# Patient Record
Sex: Female | Born: 1975 | Race: Black or African American | Hispanic: No | Marital: Single | Smoking: Never smoker
Health system: Southern US, Community
[De-identification: ages and names within clinical notes are randomized; demographics above are authoritative.]

## PROBLEM LIST (undated history)

## (undated) DIAGNOSIS — M329 Systemic lupus erythematosus, unspecified: Secondary | ICD-10-CM

## (undated) DIAGNOSIS — F5081 Binge eating disorder: Secondary | ICD-10-CM

## (undated) DIAGNOSIS — I10 Essential (primary) hypertension: Secondary | ICD-10-CM

## (undated) DIAGNOSIS — E119 Type 2 diabetes mellitus without complications: Secondary | ICD-10-CM

## (undated) DIAGNOSIS — IMO0002 Reserved for concepts with insufficient information to code with codable children: Secondary | ICD-10-CM

## (undated) DIAGNOSIS — F50819 Binge eating disorder, unspecified: Secondary | ICD-10-CM

## (undated) DIAGNOSIS — M1990 Unspecified osteoarthritis, unspecified site: Secondary | ICD-10-CM

## (undated) DIAGNOSIS — M797 Fibromyalgia: Secondary | ICD-10-CM

## (undated) HISTORY — DX: Unspecified osteoarthritis, unspecified site: M19.90

## (undated) HISTORY — PX: CHOLECYSTECTOMY: SHX55

## (undated) HISTORY — DX: Binge eating disorder, unspecified: F50.819

## (undated) HISTORY — DX: Fibromyalgia: M79.7

## (undated) HISTORY — PX: VAGINAL HYSTERECTOMY: SUR661

## (undated) HISTORY — DX: Binge eating disorder: F50.81

## (undated) HISTORY — DX: Type 2 diabetes mellitus without complications: E11.9

---

## 2016-10-23 DIAGNOSIS — M329 Systemic lupus erythematosus, unspecified: Secondary | ICD-10-CM | POA: Diagnosis not present

## 2016-10-23 DIAGNOSIS — I10 Essential (primary) hypertension: Secondary | ICD-10-CM | POA: Diagnosis not present

## 2016-10-23 DIAGNOSIS — M25561 Pain in right knee: Secondary | ICD-10-CM | POA: Diagnosis not present

## 2016-10-23 DIAGNOSIS — M25471 Effusion, right ankle: Secondary | ICD-10-CM | POA: Diagnosis not present

## 2016-10-25 DIAGNOSIS — E119 Type 2 diabetes mellitus without complications: Secondary | ICD-10-CM | POA: Diagnosis not present

## 2016-10-31 DIAGNOSIS — R05 Cough: Secondary | ICD-10-CM | POA: Diagnosis not present

## 2016-10-31 DIAGNOSIS — B9789 Other viral agents as the cause of diseases classified elsewhere: Secondary | ICD-10-CM | POA: Diagnosis not present

## 2016-10-31 DIAGNOSIS — J069 Acute upper respiratory infection, unspecified: Secondary | ICD-10-CM | POA: Diagnosis not present

## 2016-11-08 DIAGNOSIS — R05 Cough: Secondary | ICD-10-CM | POA: Diagnosis not present

## 2016-11-08 DIAGNOSIS — I10 Essential (primary) hypertension: Secondary | ICD-10-CM | POA: Diagnosis not present

## 2016-11-20 DIAGNOSIS — Z1231 Encounter for screening mammogram for malignant neoplasm of breast: Secondary | ICD-10-CM | POA: Diagnosis not present

## 2016-11-25 DIAGNOSIS — J302 Other seasonal allergic rhinitis: Secondary | ICD-10-CM | POA: Diagnosis not present

## 2016-11-25 DIAGNOSIS — M7989 Other specified soft tissue disorders: Secondary | ICD-10-CM | POA: Diagnosis not present

## 2016-11-25 DIAGNOSIS — R05 Cough: Secondary | ICD-10-CM | POA: Diagnosis not present

## 2016-11-25 DIAGNOSIS — R0602 Shortness of breath: Secondary | ICD-10-CM | POA: Diagnosis not present

## 2016-11-29 DIAGNOSIS — F411 Generalized anxiety disorder: Secondary | ICD-10-CM | POA: Diagnosis not present

## 2016-12-14 DIAGNOSIS — R0602 Shortness of breath: Secondary | ICD-10-CM | POA: Diagnosis not present

## 2016-12-14 DIAGNOSIS — M329 Systemic lupus erythematosus, unspecified: Secondary | ICD-10-CM | POA: Diagnosis not present

## 2016-12-14 DIAGNOSIS — M2550 Pain in unspecified joint: Secondary | ICD-10-CM | POA: Diagnosis not present

## 2016-12-14 DIAGNOSIS — R05 Cough: Secondary | ICD-10-CM | POA: Diagnosis not present

## 2016-12-14 DIAGNOSIS — R609 Edema, unspecified: Secondary | ICD-10-CM | POA: Diagnosis not present

## 2016-12-14 DIAGNOSIS — Z79899 Other long term (current) drug therapy: Secondary | ICD-10-CM | POA: Diagnosis not present

## 2016-12-17 DIAGNOSIS — K219 Gastro-esophageal reflux disease without esophagitis: Secondary | ICD-10-CM | POA: Diagnosis not present

## 2016-12-17 DIAGNOSIS — K900 Celiac disease: Secondary | ICD-10-CM | POA: Diagnosis not present

## 2016-12-17 DIAGNOSIS — D136 Benign neoplasm of pancreas: Secondary | ICD-10-CM | POA: Diagnosis not present

## 2016-12-20 DIAGNOSIS — M47817 Spondylosis without myelopathy or radiculopathy, lumbosacral region: Secondary | ICD-10-CM | POA: Diagnosis not present

## 2016-12-20 DIAGNOSIS — M9903 Segmental and somatic dysfunction of lumbar region: Secondary | ICD-10-CM | POA: Diagnosis not present

## 2016-12-25 DIAGNOSIS — M4317 Spondylolisthesis, lumbosacral region: Secondary | ICD-10-CM | POA: Diagnosis not present

## 2016-12-29 DIAGNOSIS — K760 Fatty (change of) liver, not elsewhere classified: Secondary | ICD-10-CM | POA: Diagnosis not present

## 2016-12-29 DIAGNOSIS — N281 Cyst of kidney, acquired: Secondary | ICD-10-CM | POA: Diagnosis not present

## 2016-12-29 DIAGNOSIS — D134 Benign neoplasm of liver: Secondary | ICD-10-CM | POA: Diagnosis not present

## 2016-12-29 DIAGNOSIS — K862 Cyst of pancreas: Secondary | ICD-10-CM | POA: Diagnosis not present

## 2017-01-07 DIAGNOSIS — M545 Low back pain: Secondary | ICD-10-CM | POA: Diagnosis not present

## 2017-01-10 DIAGNOSIS — M545 Low back pain: Secondary | ICD-10-CM | POA: Diagnosis not present

## 2017-01-15 DIAGNOSIS — M25561 Pain in right knee: Secondary | ICD-10-CM | POA: Diagnosis not present

## 2017-01-15 DIAGNOSIS — M329 Systemic lupus erythematosus, unspecified: Secondary | ICD-10-CM | POA: Diagnosis not present

## 2017-01-15 DIAGNOSIS — Z79899 Other long term (current) drug therapy: Secondary | ICD-10-CM | POA: Diagnosis not present

## 2017-01-15 DIAGNOSIS — M25562 Pain in left knee: Secondary | ICD-10-CM | POA: Diagnosis not present

## 2017-01-24 DIAGNOSIS — M545 Low back pain: Secondary | ICD-10-CM | POA: Diagnosis not present

## 2017-01-28 DIAGNOSIS — M545 Low back pain: Secondary | ICD-10-CM | POA: Diagnosis not present

## 2017-01-29 DIAGNOSIS — J3089 Other allergic rhinitis: Secondary | ICD-10-CM | POA: Diagnosis not present

## 2017-01-29 DIAGNOSIS — J301 Allergic rhinitis due to pollen: Secondary | ICD-10-CM | POA: Diagnosis not present

## 2017-01-29 DIAGNOSIS — J3081 Allergic rhinitis due to animal (cat) (dog) hair and dander: Secondary | ICD-10-CM | POA: Diagnosis not present

## 2017-01-29 DIAGNOSIS — H1045 Other chronic allergic conjunctivitis: Secondary | ICD-10-CM | POA: Diagnosis not present

## 2017-01-29 DIAGNOSIS — R05 Cough: Secondary | ICD-10-CM | POA: Diagnosis not present

## 2017-01-31 DIAGNOSIS — M545 Low back pain: Secondary | ICD-10-CM | POA: Diagnosis not present

## 2017-03-07 DIAGNOSIS — E119 Type 2 diabetes mellitus without complications: Secondary | ICD-10-CM | POA: Diagnosis not present

## 2017-03-15 DIAGNOSIS — Z113 Encounter for screening for infections with a predominantly sexual mode of transmission: Secondary | ICD-10-CM | POA: Diagnosis not present

## 2017-03-15 DIAGNOSIS — B373 Candidiasis of vulva and vagina: Secondary | ICD-10-CM | POA: Diagnosis not present

## 2017-03-15 DIAGNOSIS — N898 Other specified noninflammatory disorders of vagina: Secondary | ICD-10-CM | POA: Diagnosis not present

## 2017-04-17 DIAGNOSIS — L930 Discoid lupus erythematosus: Secondary | ICD-10-CM | POA: Diagnosis not present

## 2017-04-17 DIAGNOSIS — M329 Systemic lupus erythematosus, unspecified: Secondary | ICD-10-CM | POA: Diagnosis not present

## 2017-04-17 DIAGNOSIS — R7303 Prediabetes: Secondary | ICD-10-CM | POA: Diagnosis not present

## 2017-04-17 DIAGNOSIS — I10 Essential (primary) hypertension: Secondary | ICD-10-CM | POA: Diagnosis not present

## 2017-04-17 DIAGNOSIS — K760 Fatty (change of) liver, not elsewhere classified: Secondary | ICD-10-CM | POA: Diagnosis not present

## 2017-04-17 DIAGNOSIS — K219 Gastro-esophageal reflux disease without esophagitis: Secondary | ICD-10-CM | POA: Diagnosis not present

## 2017-04-17 DIAGNOSIS — E559 Vitamin D deficiency, unspecified: Secondary | ICD-10-CM | POA: Diagnosis not present

## 2017-04-24 DIAGNOSIS — R05 Cough: Secondary | ICD-10-CM | POA: Diagnosis not present

## 2017-04-26 DIAGNOSIS — J309 Allergic rhinitis, unspecified: Secondary | ICD-10-CM | POA: Diagnosis not present

## 2017-04-26 DIAGNOSIS — Z8709 Personal history of other diseases of the respiratory system: Secondary | ICD-10-CM | POA: Diagnosis not present

## 2017-04-26 DIAGNOSIS — J45991 Cough variant asthma: Secondary | ICD-10-CM | POA: Diagnosis not present

## 2017-05-16 DIAGNOSIS — M329 Systemic lupus erythematosus, unspecified: Secondary | ICD-10-CM | POA: Diagnosis not present

## 2017-05-27 DIAGNOSIS — Z9049 Acquired absence of other specified parts of digestive tract: Secondary | ICD-10-CM | POA: Diagnosis not present

## 2017-05-27 DIAGNOSIS — R12 Heartburn: Secondary | ICD-10-CM | POA: Diagnosis not present

## 2017-05-27 DIAGNOSIS — K449 Diaphragmatic hernia without obstruction or gangrene: Secondary | ICD-10-CM | POA: Diagnosis not present

## 2017-05-27 DIAGNOSIS — Z01818 Encounter for other preprocedural examination: Secondary | ICD-10-CM | POA: Diagnosis not present

## 2017-05-31 DIAGNOSIS — H1045 Other chronic allergic conjunctivitis: Secondary | ICD-10-CM | POA: Diagnosis not present

## 2017-05-31 DIAGNOSIS — R05 Cough: Secondary | ICD-10-CM | POA: Diagnosis not present

## 2017-05-31 DIAGNOSIS — J3089 Other allergic rhinitis: Secondary | ICD-10-CM | POA: Diagnosis not present

## 2017-05-31 DIAGNOSIS — J301 Allergic rhinitis due to pollen: Secondary | ICD-10-CM | POA: Diagnosis not present

## 2017-06-10 DIAGNOSIS — K580 Irritable bowel syndrome with diarrhea: Secondary | ICD-10-CM | POA: Diagnosis not present

## 2017-06-10 DIAGNOSIS — K219 Gastro-esophageal reflux disease without esophagitis: Secondary | ICD-10-CM | POA: Diagnosis not present

## 2017-06-10 DIAGNOSIS — D136 Benign neoplasm of pancreas: Secondary | ICD-10-CM | POA: Diagnosis not present

## 2017-06-13 DIAGNOSIS — N9089 Other specified noninflammatory disorders of vulva and perineum: Secondary | ICD-10-CM | POA: Diagnosis not present

## 2017-06-21 DIAGNOSIS — L929 Granulomatous disorder of the skin and subcutaneous tissue, unspecified: Secondary | ICD-10-CM | POA: Diagnosis not present

## 2017-06-21 DIAGNOSIS — N909 Noninflammatory disorder of vulva and perineum, unspecified: Secondary | ICD-10-CM | POA: Diagnosis not present

## 2017-06-28 DIAGNOSIS — N9089 Other specified noninflammatory disorders of vulva and perineum: Secondary | ICD-10-CM | POA: Diagnosis not present

## 2017-06-28 DIAGNOSIS — Z09 Encounter for follow-up examination after completed treatment for conditions other than malignant neoplasm: Secondary | ICD-10-CM | POA: Diagnosis not present

## 2017-07-02 DIAGNOSIS — M25561 Pain in right knee: Secondary | ICD-10-CM | POA: Diagnosis not present

## 2017-07-02 DIAGNOSIS — M1711 Unilateral primary osteoarthritis, right knee: Secondary | ICD-10-CM | POA: Diagnosis not present

## 2017-07-02 DIAGNOSIS — M25562 Pain in left knee: Secondary | ICD-10-CM | POA: Diagnosis not present

## 2017-07-02 DIAGNOSIS — M1712 Unilateral primary osteoarthritis, left knee: Secondary | ICD-10-CM | POA: Diagnosis not present

## 2017-07-02 DIAGNOSIS — G8929 Other chronic pain: Secondary | ICD-10-CM | POA: Diagnosis not present

## 2017-08-04 ENCOUNTER — Ambulatory Visit (HOSPITAL_COMMUNITY)
Admission: EM | Admit: 2017-08-04 | Discharge: 2017-08-04 | Disposition: A | Discharge status: Home / Self Care | Payer: Medicare Other | Attending: Internal Medicine | Admitting: Internal Medicine

## 2017-08-04 ENCOUNTER — Encounter (HOSPITAL_COMMUNITY): Payer: Self-pay | Admitting: Emergency Medicine

## 2017-08-04 ENCOUNTER — Telehealth (HOSPITAL_COMMUNITY): Payer: Self-pay | Admitting: Emergency Medicine

## 2017-08-04 DIAGNOSIS — Z79899 Other long term (current) drug therapy: Secondary | ICD-10-CM | POA: Insufficient documentation

## 2017-08-04 DIAGNOSIS — J069 Acute upper respiratory infection, unspecified: Secondary | ICD-10-CM | POA: Insufficient documentation

## 2017-08-04 DIAGNOSIS — J039 Acute tonsillitis, unspecified: Secondary | ICD-10-CM | POA: Diagnosis not present

## 2017-08-04 DIAGNOSIS — R52 Pain, unspecified: Secondary | ICD-10-CM | POA: Insufficient documentation

## 2017-08-04 DIAGNOSIS — M328 Other forms of systemic lupus erythematosus: Secondary | ICD-10-CM | POA: Insufficient documentation

## 2017-08-04 DIAGNOSIS — M25571 Pain in right ankle and joints of right foot: Secondary | ICD-10-CM | POA: Insufficient documentation

## 2017-08-04 DIAGNOSIS — R51 Headache: Secondary | ICD-10-CM | POA: Diagnosis present

## 2017-08-04 LAB — POCT RAPID STREP A: Streptococcus, Group A Screen (Direct): NEGATIVE

## 2017-08-04 MED ORDER — PREDNISONE 20 MG PO TABS: 40.0000 mg | ORAL_TABLET | Freq: Every day | ORAL | 0 refills | Status: DC

## 2017-08-04 MED ORDER — PENICILLIN V POTASSIUM 500 MG PO TABS: 500.0000 mg | ORAL_TABLET | Freq: Two times a day (BID) | ORAL | 0 refills | Status: DC

## 2017-08-04 MED ORDER — PREDNISONE 20 MG PO TABS: 40.0000 mg | ORAL_TABLET | Freq: Every day | ORAL | 0 refills | Status: AC

## 2017-08-04 NOTE — ED Triage Notes (Signed)
Pt c/o cold sx onset: 1 week  Sx include: sneezing, dry cough, nasal congestion, ST  Denies fevers.   Reports niece is being treated for strep  Also c/o left ankle pain onset 2 weeks.... Hx of lupus. .... Denies inj/trauma  Taking: OTC cold meds w/temp relief.   A&O x4... NAD

## 2017-08-04 NOTE — Telephone Encounter (Signed)
Pt requested Rx to be sent to [facility] [address] [facility] to pharm of choice.

## 2017-08-04 NOTE — ED Provider Notes (Signed)
[place]    CSN: [id number] Arrival date & time: 08/04/17  1413     History   Chief Complaint Chief Complaint  Patient presents with  . URI    HPI Debra Cooper is a 41 y.o. female.  Her niece was diagnosed with strep throat in the emergency room last night, and she has been eating and drinking after her niece. Patient reports a one-week history of bad sore throat, some nasal congestion and a little bit of cough. No fever. Had some headache 2 weeks ago. Patient also has a history of lupus, follows with a rheumatologist in [place], where she is visiting from. For the last 2 weeks she has had pain in the medial right ankle with weightbearing, not getting worse but not resolving.  This reminds her of pain from similar lupus flares. She was able to walk into the urgent care independently    HPI  History reviewed. No pertinent past medical history.   History reviewed. No pertinent surgical history.    Home Medications    Prior to Admission medications   Medication Sig Start Date End Date Taking? Authorizing Provider  chlorthalidone (HYGROTON) 25 MG tablet Take 25 mg daily by mouth.   Yes [provider]  dicyclomine (BENTYL) 20 MG tablet Take 20 mg every 6 (six) hours by mouth.   Yes [provider]  hydroxychloroquine (PLAQUENIL) 200 MG tablet Take daily by mouth.   Yes [provider]  labetalol (NORMODYNE) 100 MG tablet Take 100 mg 2 (two) times daily by mouth.   Yes [provider]  Nutritional Supplements (EQUATE PO) Take by mouth.   Yes [provider]  omeprazole (PRILOSEC) 40 MG capsule Take 40 mg daily by mouth.   Yes [provider]  penicillin v potassium (VEETID) 500 MG tablet Take 1 tablet (500 mg total) 2 (two) times daily by mouth. 08/04/17   Sherlene Shams, MD  predniSONE (DELTASONE) 20 MG tablet Take 2 tablets (40 mg total) daily for 5 days by mouth. 08/04/17 08/09/17  Sherlene Shams, MD     Family History History reviewed. No pertinent family history.  Social History Social History   Tobacco Use  . Smoking status: Never Smoker  . Smokeless tobacco: Never Used  Substance Use Topics  . Alcohol use: No    Frequency: Never  . Drug use: No     Allergies   Patient has no known allergies.   Review of Systems Review of Systems  All other systems reviewed and are negative.    Physical Exam Triage Vital Signs ED Triage Vitals [08/04/17 1454]  Enc Vitals Group     BP 136/84     Pulse Rate 96     Resp 20     Temp 98.6 F (37 C)     Temp Source Oral     SpO2 100 %     Weight      Height      Pain Score      Pain Loc    Updated Vital Signs BP 136/84 (BP Location: Left Arm)   Pulse 96   Temp 98.6 F (37 C) (Oral)   Resp 20   SpO2 100%  Physical Exam  Constitutional: She is oriented to person, place, and time. No distress.  Alert, nicely groomed  HENT:  Head: Atraumatic.  Left ear is waxy, right TM is translucent, slightly flushed Right nose is quite congested, left nose is moderately congested Tonsils are  quite prominent, deep red  Eyes:  Conjugate gaze, no eye redness/drainage  Neck: Neck supple.  Cardiovascular: Normal rate and regular rhythm.  Pulmonary/Chest: No respiratory distress. She has no wheezes. She has no rales.  Lungs clear, symmetric breath sounds  Abdominal: She exhibits no distension.  Musculoskeletal: Normal range of motion.  No leg swelling Symmetric range of motion of both ankles, symmetric contours. No warmth palpable, no redness/effusion grossly evident. Patient was able to walk into the urgent care independently, and climb on/off the exam table without assistance. She was able to remove her own shoes and socks. Skin is intact no rash  Neurological: She is alert and oriented to person, place, and time.  Skin: Skin is warm and dry.  No cyanosis  Nursing note and vitals reviewed.    UC Treatments / Results   Labs Results for orders placed or performed during the hospital encounter of 08/04/17  Culture, group A strep  Result Value Ref Range   Specimen Description THROAT    Special Requests NONE    Culture CULTURE REINCUBATED FOR BETTER GROWTH    Report Status PENDING   POCT rapid strep A [facility])  Result Value Ref Range   Streptococcus, Group A Screen (Direct) NEGATIVE NEGATIVE    Procedures Procedures (including critical care time) None today  Final Clinical Impressions(s) / UC Diagnoses   Final diagnoses:  Acute tonsillitis, unspecified etiology  Acute right ankle pain  Other forms of systemic lupus erythematosus, unspecified organ involvement status ([place])    Meds ordered this encounter  Medications  . predniSONE (DELTASONE) 20 MG tablet    Sig: Take 2 tablets (40 mg total) daily for 5 days by mouth.    Dispense:  10 tablet    Refill:  0  . penicillin v potassium (VEETID) 500 MG tablet    Sig: Take 1 tablet (500 mg total) 2 (two) times daily by mouth.    Dispense:  20 tablet    Refill:  0   Strep swab at the urgent care was negative today. However, because of household members with strep infections will send prescription for penicillin today. Throat culture is pending. Ankle pain may or may not represent a lupus flare. A short pulse of prednisone was also sent to the pharmacy. Recheck or follow up with your primary care provider for further evaluation if symptoms are not improving soon.  Controlled Substance Prescriptions [place] Controlled Substance Registry consulted? Not Applicable   Sherlene Shams, MD 08/05/17 2013

## 2017-08-04 NOTE — Discharge Instructions (Addendum)
Strep swab at the urgent care was negative today. However, because of household members with strep infections will send prescription for penicillin today. Throat culture is pending. Ankle pain may or may not represent a lupus flare. A short pulse of prednisone was also sent to the pharmacy. Recheck or follow up with your primary care provider for further evaluation if symptoms are not improving soon.

## 2017-08-07 LAB — CULTURE, GROUP A STREP (THRC)

## 2017-08-10 ENCOUNTER — Encounter (HOSPITAL_COMMUNITY): Payer: Self-pay | Admitting: Emergency Medicine

## 2017-08-10 ENCOUNTER — Ambulatory Visit (HOSPITAL_COMMUNITY)
Admission: EM | Admit: 2017-08-10 | Discharge: 2017-08-10 | Disposition: A | Discharge status: Home / Self Care | Payer: Medicare Other | Attending: Radiology | Admitting: Radiology

## 2017-08-10 DIAGNOSIS — B308 Other viral conjunctivitis: Secondary | ICD-10-CM

## 2017-08-10 HISTORY — DX: Systemic lupus erythematosus, unspecified: M32.9

## 2017-08-10 HISTORY — DX: Reserved for concepts with insufficient information to code with codable children: IMO0002

## 2017-08-10 HISTORY — DX: Essential (primary) hypertension: I10

## 2017-08-10 MED ORDER — FLUTICASONE PROPIONATE 50 MCG/ACT NA SUSP: 1.0000 | Freq: Every day | NASAL | 2 refills | Status: DC

## 2017-08-10 MED ORDER — TOBRAMYCIN-DEXAMETHASONE 0.3-0.1 % OP SUSP: 1.0000 [drp] | OPHTHALMIC | 0 refills | Status: DC

## 2017-08-10 NOTE — ED Triage Notes (Signed)
Pt here for URI sx with eye irritation and ear fullness x 1 week

## 2017-08-10 NOTE — ED Provider Notes (Signed)
[place]    CSN: [id number] Arrival date & time: 08/10/17  1256     History   Chief Complaint Chief Complaint  Patient presents with  . Eye Pain  . Otalgia    HPI Debra Cooper is a 41 y.o. female.   41 y.o. female presents with discharge bilateral eyes x 2 days and ear pressure intermittent X 1 week   in nature. Condition is made better by acute.Condition is made worse when waking up and lying down. Patient states that she was seen at this facility 6 days ago and given a prescription of PCN for strep throat patient states that she feel no better.  Patient denies any relief from PCN prior to there arrival at this facility. Patient denies any fevers, nausea vomiting diarrhea. Patient has history of  Allergies that she takes medication for.  Patient denies any pain but states that her vision is blurred.       Past Medical History:  Diagnosis Date  . Hypertension   . Lupus     There are no active problems to display for this patient.   History reviewed. No pertinent surgical history.  OB History    No data available       Home Medications    Prior to Admission medications   Medication Sig Start Date End Date Taking? Authorizing Provider  chlorthalidone (HYGROTON) 25 MG tablet Take 25 mg daily by mouth.    [provider]  dicyclomine (BENTYL) 20 MG tablet Take 20 mg every 6 (six) hours by mouth.    [provider]  hydroxychloroquine (PLAQUENIL) 200 MG tablet Take daily by mouth.    [provider]  labetalol (NORMODYNE) 100 MG tablet Take 100 mg 2 (two) times daily by mouth.    [provider]  Nutritional Supplements (EQUATE PO) Take by mouth.    [provider]  omeprazole (PRILOSEC) 40 MG capsule Take 40 mg daily by mouth.    [provider]  penicillin v potassium (VEETID) 500 MG tablet Take 1 tablet (500 mg total) 2 (two) times daily by mouth. 08/04/17   Sherlene Shams, MD    Family  History History reviewed. No pertinent family history.  Social History Social History   Tobacco Use  . Smoking status: Never Smoker  . Smokeless tobacco: Never Used  Substance Use Topics  . Alcohol use: No    Frequency: Never  . Drug use: No     Allergies   Patient has no known allergies.   Review of Systems Review of Systems  Constitutional: Negative for chills and fever.  HENT: Negative for sore throat.   Eyes: Positive for discharge ( both eyes) and redness ( both eyes). Negative for pain and visual disturbance.  Respiratory: Negative for cough and shortness of breath.   Cardiovascular: Negative for chest pain and palpitations.  Gastrointestinal: Negative for abdominal pain and vomiting.  Genitourinary: Negative for dysuria and hematuria.  Musculoskeletal: Negative for arthralgias and back pain.  Skin: Negative for color change and rash.  Neurological: Negative for seizures and syncope.  All other systems reviewed and are negative.    Physical Exam Triage Vital Signs ED Triage Vitals [08/10/17 1336]  Enc Vitals Group     BP (!) 142/87     Pulse Rate 92     Resp 18     Temp 98.1 F (36.7 C)     Temp Source Oral     SpO2 100 %  Weight      Height      Head Circumference      Peak Flow      Pain Score 10     Pain Loc      Pain Edu?      Excl. in [place]?    No data found.  Updated Vital Signs BP (!) 142/87 (BP Location: Right Arm)   Pulse 92   Temp 98.1 F (36.7 C) (Oral)   Resp 18   SpO2 100%   Visual Acuity Right Eye Distance:   Left Eye Distance:   Bilateral Distance:    Right Eye Near:   Left Eye Near:    Bilateral Near:     Physical Exam  Constitutional: She is oriented to person, place, and time. She appears well-developed and well-nourished.  HENT:  Head: Normocephalic and atraumatic.  Right Ear: External ear normal.  Left Ear: External ear normal.  Eyes:  Erythema noted to bilateral conjunctivae with discharge  Neck: Normal  range of motion.  Pulmonary/Chest: Effort normal.  Neurological: She is alert and oriented to person, place, and time.  Psychiatric: She has a normal mood and affect.  Nursing note and vitals reviewed.    UC Treatments / Results  Labs (all labs ordered are listed, but only abnormal results are displayed) Labs Reviewed - No data to display  EKG  EKG Interpretation None       Radiology No results found.  Procedures Procedures (including critical care time)  Medications Ordered in UC Medications - No data to display   Initial Impression / Assessment and Plan / UC Course  I have reviewed the triage vital signs and the nursing notes.  Pertinent labs & imaging results that were available during my care of the patient were reviewed by me and considered in my medical decision making (see chart for details).       Final Clinical Impressions(s) / UC Diagnoses   Final diagnoses:  None    ED Discharge Orders    None       Controlled Substance Prescriptions [place] Controlled Substance Registry consulted? Not Applicable   Jacqualine Mau, NP 08/10/17 1503

## 2017-11-14 DIAGNOSIS — I10 Essential (primary) hypertension: Secondary | ICD-10-CM | POA: Diagnosis not present

## 2017-11-14 DIAGNOSIS — Z23 Encounter for immunization: Secondary | ICD-10-CM | POA: Diagnosis not present

## 2017-11-14 DIAGNOSIS — Z1331 Encounter for screening for depression: Secondary | ICD-10-CM | POA: Diagnosis not present

## 2017-11-14 DIAGNOSIS — K900 Celiac disease: Secondary | ICD-10-CM | POA: Diagnosis not present

## 2017-12-04 DIAGNOSIS — Z79899 Other long term (current) drug therapy: Secondary | ICD-10-CM | POA: Diagnosis not present

## 2017-12-04 DIAGNOSIS — H40013 Open angle with borderline findings, low risk, bilateral: Secondary | ICD-10-CM | POA: Diagnosis not present

## 2017-12-04 DIAGNOSIS — E119 Type 2 diabetes mellitus without complications: Secondary | ICD-10-CM | POA: Diagnosis not present

## 2017-12-13 DIAGNOSIS — J4531 Mild persistent asthma with (acute) exacerbation: Secondary | ICD-10-CM | POA: Diagnosis not present

## 2017-12-13 DIAGNOSIS — J3089 Other allergic rhinitis: Secondary | ICD-10-CM | POA: Diagnosis not present

## 2017-12-13 DIAGNOSIS — J301 Allergic rhinitis due to pollen: Secondary | ICD-10-CM | POA: Diagnosis not present

## 2017-12-13 DIAGNOSIS — H1045 Other chronic allergic conjunctivitis: Secondary | ICD-10-CM | POA: Diagnosis not present

## 2017-12-16 DIAGNOSIS — F5081 Binge eating disorder: Secondary | ICD-10-CM | POA: Diagnosis not present

## 2017-12-17 DIAGNOSIS — Z79899 Other long term (current) drug therapy: Secondary | ICD-10-CM | POA: Diagnosis not present

## 2017-12-17 DIAGNOSIS — M170 Bilateral primary osteoarthritis of knee: Secondary | ICD-10-CM | POA: Diagnosis not present

## 2017-12-17 DIAGNOSIS — M329 Systemic lupus erythematosus, unspecified: Secondary | ICD-10-CM | POA: Diagnosis not present

## 2017-12-26 DIAGNOSIS — R932 Abnormal findings on diagnostic imaging of liver and biliary tract: Secondary | ICD-10-CM | POA: Diagnosis not present

## 2017-12-26 DIAGNOSIS — K219 Gastro-esophageal reflux disease without esophagitis: Secondary | ICD-10-CM | POA: Diagnosis not present

## 2017-12-30 DIAGNOSIS — K862 Cyst of pancreas: Secondary | ICD-10-CM | POA: Diagnosis not present

## 2017-12-30 DIAGNOSIS — D134 Benign neoplasm of liver: Secondary | ICD-10-CM | POA: Diagnosis not present

## 2018-01-07 DIAGNOSIS — N898 Other specified noninflammatory disorders of vagina: Secondary | ICD-10-CM | POA: Diagnosis not present

## 2018-01-07 DIAGNOSIS — N760 Acute vaginitis: Secondary | ICD-10-CM | POA: Diagnosis not present

## 2018-01-07 DIAGNOSIS — Z113 Encounter for screening for infections with a predominantly sexual mode of transmission: Secondary | ICD-10-CM | POA: Diagnosis not present

## 2018-01-07 DIAGNOSIS — B9689 Other specified bacterial agents as the cause of diseases classified elsewhere: Secondary | ICD-10-CM | POA: Diagnosis not present

## 2018-02-12 DIAGNOSIS — M545 Low back pain: Secondary | ICD-10-CM | POA: Diagnosis not present

## 2018-02-12 DIAGNOSIS — G8929 Other chronic pain: Secondary | ICD-10-CM | POA: Diagnosis not present

## 2018-02-21 DIAGNOSIS — J301 Allergic rhinitis due to pollen: Secondary | ICD-10-CM | POA: Diagnosis not present

## 2018-02-21 DIAGNOSIS — H1045 Other chronic allergic conjunctivitis: Secondary | ICD-10-CM | POA: Diagnosis not present

## 2018-02-21 DIAGNOSIS — J3089 Other allergic rhinitis: Secondary | ICD-10-CM | POA: Diagnosis not present

## 2018-02-21 DIAGNOSIS — J4530 Mild persistent asthma, uncomplicated: Secondary | ICD-10-CM | POA: Diagnosis not present

## 2018-02-25 DIAGNOSIS — Z01419 Encounter for gynecological examination (general) (routine) without abnormal findings: Secondary | ICD-10-CM | POA: Diagnosis not present

## 2018-02-25 DIAGNOSIS — Z1231 Encounter for screening mammogram for malignant neoplasm of breast: Secondary | ICD-10-CM | POA: Diagnosis not present

## 2018-02-28 DIAGNOSIS — M47817 Spondylosis without myelopathy or radiculopathy, lumbosacral region: Secondary | ICD-10-CM | POA: Diagnosis not present

## 2018-02-28 DIAGNOSIS — H40013 Open angle with borderline findings, low risk, bilateral: Secondary | ICD-10-CM | POA: Diagnosis not present

## 2018-02-28 DIAGNOSIS — M9903 Segmental and somatic dysfunction of lumbar region: Secondary | ICD-10-CM | POA: Diagnosis not present

## 2018-03-14 DIAGNOSIS — I10 Essential (primary) hypertension: Secondary | ICD-10-CM | POA: Diagnosis not present

## 2018-03-14 DIAGNOSIS — M25562 Pain in left knee: Secondary | ICD-10-CM | POA: Diagnosis not present

## 2018-03-14 DIAGNOSIS — G8929 Other chronic pain: Secondary | ICD-10-CM | POA: Diagnosis not present

## 2018-03-14 DIAGNOSIS — M25561 Pain in right knee: Secondary | ICD-10-CM | POA: Diagnosis not present

## 2018-05-03 ENCOUNTER — Ambulatory Visit (HOSPITAL_COMMUNITY)
Admission: EM | Admit: 2018-05-03 | Discharge: 2018-05-03 | Disposition: A | Discharge status: Home / Self Care | Payer: Medicare Other | Attending: Family Medicine | Admitting: Family Medicine

## 2018-05-03 ENCOUNTER — Encounter (HOSPITAL_COMMUNITY): Payer: Self-pay | Admitting: Emergency Medicine

## 2018-05-03 DIAGNOSIS — M25562 Pain in left knee: Secondary | ICD-10-CM | POA: Diagnosis not present

## 2018-05-03 DIAGNOSIS — M25561 Pain in right knee: Secondary | ICD-10-CM | POA: Diagnosis not present

## 2018-05-03 MED ORDER — PREDNISONE 10 MG (48) PO TBPK: ORAL_TABLET | ORAL | 0 refills | Status: DC

## 2018-05-03 NOTE — ED Triage Notes (Signed)
Pt c/o bilateral knee pain, and hx of lupus with body aches, states she also has arthritis. Pt stastes she ran out of her meds.

## 2018-05-07 NOTE — ED Provider Notes (Signed)
[place]   [id number] 05/03/18 Arrival Time: 4696  ASSESSMENT & PLAN:  1. Acute pain of both knees   Chronic.  Meds ordered this encounter  Medications  . predniSONE (STERAPRED UNI-PAK 48 TAB) 10 MG (48) TBPK tablet    Sig: Take as directed.    Dispense:  48 tablet    Refill:  0   If this continues, she may benefit from orthopaedic or sports medicine evaluation. Discussed.  Reviewed expectations re: course of current medical issues. Questions answered. Outlined signs and symptoms indicating need for more acute intervention. Patient verbalized understanding. After Visit Summary given.  SUBJECTIVE: History from: patient. Debra Cooper is a 42 y.o. female who reports intermittent moderate pain of her bilateral knees that is stable; described as aching without radiation. Has sufferedwith this for several years. Has taken prednisone in the past with good results. H/O lupus and arthritis that contributes. Injury/trama: no. Relieved by: rest. Worsened by: certain movements. Associated symptoms: none reported. Extremity sensation changes or weakness: none. Self treatment: tried OTCs without relief of pain.  ROS: As per HPI.   OBJECTIVE:  Vitals:   05/03/18 1759  BP: (!) 146/92  Pulse: 89  Resp: 18  Temp: 98 F (36.7 C)  TempSrc: Temporal  SpO2: 100%    General appearance: alert; no distress Extremities: warm and well perfused; symmetrical with no gross deformities; diffuse 'soreness' of her bilateral knees on exam with no swelling and no bruising; ROM: normal, 'but hurts to move' CV: normal extremity capillary refill Skin: warm and dry Neurologic: normal gait; normal symmetric reflexes in all extremities; normal sensation in all extremities Psychological: alert and cooperative; normal mood and affect  No Known Allergies  Past Medical History:  Diagnosis Date  . Hypertension   . Lupus ([place])    Social History   Socioeconomic History  . Marital  status: Single    Spouse name: Not on file  . Number of children: Not on file  . Years of education: Not on file  . Highest education level: Not on file  Occupational History  . Not on file  Social Needs  . Financial resource strain: Not on file  . Food insecurity:    Worry: Not on file    Inability: Not on file  . Transportation needs:    Medical: Not on file    Non-medical: Not on file  Tobacco Use  . Smoking status: Never Smoker  . Smokeless tobacco: Never Used  Substance and Sexual Activity  . Alcohol use: No    Frequency: Never  . Drug use: No  . Sexual activity: Not on file  Lifestyle  . Physical activity:    Days per week: Not on file    Minutes per session: Not on file  . Stress: Not on file  Relationships  . Social connections:    Talks on phone: Not on file    Gets together: Not on file    Attends religious service: Not on file    Active member of club or organization: Not on file    Attends meetings of clubs or organizations: Not on file    Relationship status: Not on file  . Intimate partner violence:    Fear of current or ex partner: Not on file    Emotionally abused: Not on file    Physically abused: Not on file    Forced sexual activity: Not on file  Other Topics Concern  . Not on file  Social History Narrative  .  Not on file   No family history on file. History reviewed. No pertinent surgical history.    Vanessa Kick, MD 05/07/18 [phone]

## 2018-06-18 ENCOUNTER — Ambulatory Visit (HOSPITAL_COMMUNITY)
Admission: EM | Admit: 2018-06-18 | Discharge: 2018-06-18 | Disposition: A | Discharge status: Home / Self Care | Payer: Medicare Other

## 2018-06-23 ENCOUNTER — Ambulatory Visit (HOSPITAL_COMMUNITY)
Admission: EM | Admit: 2018-06-23 | Discharge: 2018-06-23 | Disposition: A | Discharge status: Home / Self Care | Payer: Medicare Other | Attending: Family Medicine | Admitting: Family Medicine

## 2018-06-23 ENCOUNTER — Encounter (HOSPITAL_COMMUNITY): Payer: Self-pay | Admitting: Emergency Medicine

## 2018-06-23 DIAGNOSIS — M25561 Pain in right knee: Secondary | ICD-10-CM

## 2018-06-23 DIAGNOSIS — N760 Acute vaginitis: Secondary | ICD-10-CM

## 2018-06-23 DIAGNOSIS — Z791 Long term (current) use of non-steroidal anti-inflammatories (NSAID): Secondary | ICD-10-CM | POA: Diagnosis not present

## 2018-06-23 DIAGNOSIS — Z7952 Long term (current) use of systemic steroids: Secondary | ICD-10-CM | POA: Insufficient documentation

## 2018-06-23 DIAGNOSIS — M25562 Pain in left knee: Secondary | ICD-10-CM

## 2018-06-23 DIAGNOSIS — Z79899 Other long term (current) drug therapy: Secondary | ICD-10-CM | POA: Diagnosis not present

## 2018-06-23 DIAGNOSIS — Z7951 Long term (current) use of inhaled steroids: Secondary | ICD-10-CM | POA: Diagnosis not present

## 2018-06-23 DIAGNOSIS — M329 Systemic lupus erythematosus, unspecified: Secondary | ICD-10-CM | POA: Insufficient documentation

## 2018-06-23 DIAGNOSIS — G8929 Other chronic pain: Secondary | ICD-10-CM | POA: Diagnosis not present

## 2018-06-23 DIAGNOSIS — I10 Essential (primary) hypertension: Secondary | ICD-10-CM | POA: Diagnosis not present

## 2018-06-23 MED ORDER — MELOXICAM 7.5 MG PO TABS: 7.5000 mg | ORAL_TABLET | Freq: Every day | ORAL | 0 refills | Status: DC

## 2018-06-23 MED ORDER — FLUCONAZOLE 200 MG PO TABS: 200.0000 mg | ORAL_TABLET | Freq: Once | ORAL | 0 refills | Status: AC

## 2018-06-23 MED ORDER — PREDNISONE 10 MG (21) PO TBPK: ORAL_TABLET | Freq: Every day | ORAL | 0 refills | Status: DC

## 2018-06-23 NOTE — ED Triage Notes (Signed)
Pt also states she has vaginal itching since July

## 2018-06-23 NOTE — ED Triage Notes (Signed)
Pt c/o chronic bilateral knee pain, hx of arthritis. States she was here in august for the same and given steriods.

## 2018-06-23 NOTE — ED Provider Notes (Signed)
[place]    CSN: [id number] Arrival date & time: 06/23/18  [place]     History   Chief Complaint Chief Complaint  Patient presents with  . Knee Pain  . Vaginal Itching    HPI Debra Cooper is a 42 y.o. female.   Debra Cooper presents with complaints of persistent chronic bilateral knee pain. No specific injury. States feels similar to her arthritis pain. Was seen here in the past and provided with steroids which does help. No numbness, tingling or weakness. Pain worse with activity. Pain 6/10. No redness. Occasionally swelling. Hx of lupus. Moved here in June, does not have a local PCP. States also has vaginal itching with odor. No noticeable discharge. This has been ongoing for a few weeks. Took some OTC AZO which did not seem to help. Denies abdominal pain, sores, lesions, urinary symptoms.    ROS per HPI.      Past Medical History:  Diagnosis Date  . Hypertension   . Lupus ([place])     There are no active problems to display for this patient.   History reviewed. No pertinent surgical history.  OB History   None      Home Medications    Prior to Admission medications   Medication Sig Start Date End Date Taking? Authorizing Provider  chlorthalidone (HYGROTON) 25 MG tablet Take 25 mg daily by mouth.    [provider]  dicyclomine (BENTYL) 20 MG tablet Take 20 mg every 6 (six) hours by mouth.    [provider]  fluconazole (DIFLUCAN) 200 MG tablet Take 1 tablet (200 mg total) by mouth once for 1 dose. 06/23/18 06/23/18  Zigmund Gottron, NP  fluticasone (FLONASE) 50 MCG/ACT nasal spray Place 1 spray daily into both nostrils. 08/10/17   Jacqualine Mau, NP  hydroxychloroquine (PLAQUENIL) 200 MG tablet Take daily by mouth.    [provider]  labetalol (NORMODYNE) 100 MG tablet Take 100 mg 2 (two) times daily by mouth.    [provider]  losartan (COZAAR) 100 MG tablet Take 100 mg by mouth daily.    [provider]  meloxicam (MOBIC) 7.5 MG tablet Take 1 tablet (7.5 mg total) by mouth daily. 06/23/18   Zigmund Gottron, NP  Nutritional Supplements (EQUATE PO) Take by mouth.    [provider]  omeprazole (PRILOSEC) 40 MG capsule Take 40 mg daily by mouth.    [provider]  predniSONE (STERAPRED UNI-PAK 21 TAB) 10 MG (21) TBPK tablet Take by mouth daily. Per box instruct 06/23/18   Zigmund Gottron, NP  tobramycin-dexamethasone [facility]) ophthalmic solution Place 1 drop every 4 (four) hours while awake into both eyes. 08/10/17   Jacqualine Mau, NP    Family History No family history on file.  Social History Social History   Tobacco Use  . Smoking status: Never Smoker  . Smokeless tobacco: Never Used  Substance Use Topics  . Alcohol use: No    Frequency: Never  . Drug use: No     Allergies   Patient has no known allergies.   Review of Systems Review of Systems   Physical Exam Triage Vital Signs ED Triage Vitals [06/23/18 1947]  Enc Vitals Group     BP (!) 142/68     Pulse Rate 86     Resp 18     Temp 98 F (36.7 C)     Temp src      SpO2 100 %  Weight      Height      Head Circumference      Peak Flow      Pain Score      Pain Loc      Pain Edu?      Excl. in [place]?    No data found.  Updated Vital Signs BP (!) 142/68   Pulse 86   Temp 98 F (36.7 C)   Resp 18   SpO2 100%    Physical Exam  Constitutional: She is oriented to person, place, and time. She appears well-developed and well-nourished. No distress.  Cardiovascular: Normal rate, regular rhythm and normal heart sounds.  Pulmonary/Chest: Effort normal and breath sounds normal.  Abdominal: Soft. There is no tenderness. There is no rigidity, no rebound, no guarding and no CVA tenderness.  Genitourinary:  Genitourinary Comments: Denies sores, lesions, vaginal bleeding; no pelvic pain; gu exam deferred at this time, vaginal self swab collected.    Musculoskeletal:       Right  knee: Tenderness found.       Left knee: Tenderness found.  Bilateral knees with generalized tenderness; full ROm without restriction; no redness or swelling; strength equal bilaterally; gross sensation intact to bilateral lower extremities;   Neurological: She is alert and oriented to person, place, and time.  Skin: Skin is warm and dry.     UC Treatments / Results  Labs (all labs ordered are listed, but only abnormal results are displayed) Labs Reviewed  CERVICOVAGINAL ANCILLARY ONLY    EKG None  Radiology No results found.  Procedures Procedures (including critical care time)  Medications Ordered in UC Medications - No data to display  Initial Impression / Assessment and Plan / UC Course  I have reviewed the triage vital signs and the nursing notes.  Pertinent labs & imaging results that were available during my care of the patient were reviewed by me and considered in my medical decision making (see chart for details).     Non toxic, afebrile. Acute on chronic symptoms. Prednisone pack to be followed with meloxicam. Concern for yeast infection, vaginal swab pending. Diflucan provided. Will notify of any positive findings and if any changes to treatment are needed.  Encouraged establish with PCP for recheck and management of chronic illness. Patient verbalized understanding and agreeable to plan.  Ambulatory out of clinic without difficulty.   Final Clinical Impressions(s) / UC Diagnoses   Final diagnoses:  Chronic pain of both knees  Acute vaginitis     Discharge Instructions     Prednisone pack for knees. Once completed may start daily meloxicam, take with food and don't take additional ibuprofen.  Diflucan tab x1 for concern for vaginal yeast.  Will notify you of any positive findings from your vaginal swab and if any changes to treatment are needed.   Please establish with a primary care provider for continued management of your symptoms.    ED Prescriptions     Medication Sig Dispense Auth. Provider   predniSONE (STERAPRED UNI-PAK 21 TAB) 10 MG (21) TBPK tablet Take by mouth daily. Per box instruct 21 tablet Kaio Kuhlman B, NP   meloxicam (MOBIC) 7.5 MG tablet Take 1 tablet (7.5 mg total) by mouth daily. 30 tablet Augusto Gamble B, NP   fluconazole (DIFLUCAN) 200 MG tablet Take 1 tablet (200 mg total) by mouth once for 1 dose. 1 tablet Zigmund Gottron, NP     Controlled Substance Prescriptions [place] Controlled Substance Registry consulted? Not Applicable  Zigmund Gottron, NP 06/23/18 2100

## 2018-06-23 NOTE — Discharge Instructions (Signed)
Prednisone pack for knees. Once completed may start daily meloxicam, take with food and don't take additional ibuprofen.  Diflucan tab x1 for concern for vaginal yeast.  Will notify you of any positive findings from your vaginal swab and if any changes to treatment are needed.   Please establish with a primary care provider for continued management of your symptoms.

## 2018-06-25 LAB — CERVICOVAGINAL ANCILLARY ONLY
Bacterial vaginitis: NEGATIVE
CANDIDA VAGINITIS: NEGATIVE
Chlamydia: NEGATIVE
Neisseria Gonorrhea: NEGATIVE
TRICH (WINDOWPATH): NEGATIVE

## 2018-07-17 ENCOUNTER — Encounter (HOSPITAL_COMMUNITY): Payer: Self-pay | Admitting: Emergency Medicine

## 2018-07-17 ENCOUNTER — Ambulatory Visit (HOSPITAL_COMMUNITY)
Admission: EM | Admit: 2018-07-17 | Discharge: 2018-07-17 | Disposition: A | Discharge status: Home / Self Care | Payer: Medicare Other | Attending: Family Medicine | Admitting: Family Medicine

## 2018-07-17 DIAGNOSIS — M170 Bilateral primary osteoarthritis of knee: Secondary | ICD-10-CM | POA: Diagnosis not present

## 2018-07-17 MED ORDER — NAPROXEN 500 MG PO TABS: 500.0000 mg | ORAL_TABLET | Freq: Two times a day (BID) | ORAL | 0 refills | Status: DC

## 2018-07-17 NOTE — ED Triage Notes (Signed)
Pt states she has arthritis and lupus and ran out of the steroid she uses for it.

## 2018-07-17 NOTE — Discharge Instructions (Addendum)
We will hold off on steroid today due to the potential side effects of prolonged use such as lowering your immune system, increased appetite, weight gain, osteoporosis, high blood pressure, muscle weakness, etc.. Brace place.  Use as needed for comfort Continue conservative management of rest, ice, and elevation Discontinue mobic We will try naproxen as needed for pain relief (may cause abdominal discomfort, ulcers, and GI bleeds avoid taking with other NSAIDs) PCP assistance initiated Follow up with orthopedist for further evaluation and management of chronic knee arthritis  Return or go to the ER if you have any new or worsening symptoms (fever, chills, chest pain, abdominal pain, changes in bowel or bladder habits, pain radiating into lower legs, etc...)

## 2018-07-17 NOTE — ED Provider Notes (Signed)
[place]   [id number] 07/17/18 Arrival Time: 1316  CC: Bilateral knee pain  SUBJECTIVE: History from: patient. Debra Cooper is a 42 y.o. female complains of chronic bilateral knee arthritis (R>L)  that has flared up within the last week.  Denies a specific injury, or precipitating event, but works on her knee at a warehouse.  Was seen here on 06/23/18 and given a steroid pack.  Reports improvement in symptoms with medication, but symptoms have return.  Symptoms made worse with weight-bearing and walking.   Has tried mobic with temporary relief.  Moved here from [place] in June and has not gotten established with a PCP.  Did received cortisone shots in her knees in [place] that gave her relief.  Denies fever, chills, erythema, swelling, weakness, numbness or tingling.    ROS: As per HPI.  Past Medical History:  Diagnosis Date  . Hypertension   . Lupus ([place])    History reviewed. No pertinent surgical history. No Known Allergies No current facility-administered medications on file prior to encounter.    Current Outpatient Medications on File Prior to Encounter  Medication Sig Dispense Refill  . chlorthalidone (HYGROTON) 25 MG tablet Take 25 mg daily by mouth.    . dicyclomine (BENTYL) 20 MG tablet Take 20 mg every 6 (six) hours by mouth.    . fluticasone (FLONASE) 50 MCG/ACT nasal spray Place 1 spray daily into both nostrils. 16 g 2  . hydroxychloroquine (PLAQUENIL) 200 MG tablet Take daily by mouth.    . labetalol (NORMODYNE) 100 MG tablet Take 100 mg 2 (two) times daily by mouth.    . losartan (COZAAR) 100 MG tablet Take 100 mg by mouth daily.    . Nutritional Supplements (EQUATE PO) Take by mouth.    Marland Kitchen omeprazole (PRILOSEC) 40 MG capsule Take 40 mg daily by mouth.    . tobramycin-dexamethasone (TOBRADEX) ophthalmic solution Place 1 drop every 4 (four) hours while awake into both eyes. 5 mL 0   Social History   Socioeconomic History  . Marital status:  Single    Spouse name: Not on file  . Number of children: Not on file  . Years of education: Not on file  . Highest education level: Not on file  Occupational History  . Not on file  Social Needs  . Financial resource strain: Not on file  . Food insecurity:    Worry: Not on file    Inability: Not on file  . Transportation needs:    Medical: Not on file    Non-medical: Not on file  Tobacco Use  . Smoking status: Never Smoker  . Smokeless tobacco: Never Used  Substance and Sexual Activity  . Alcohol use: No    Frequency: Never  . Drug use: No  . Sexual activity: Not on file  Lifestyle  . Physical activity:    Days per week: Not on file    Minutes per session: Not on file  . Stress: Not on file  Relationships  . Social connections:    Talks on phone: Not on file    Gets together: Not on file    Attends religious service: Not on file    Active member of club or organization: Not on file    Attends meetings of clubs or organizations: Not on file    Relationship status: Not on file  . Intimate partner violence:    Fear of current or ex partner: Not on file    Emotionally abused: Not on  file    Physically abused: Not on file    Forced sexual activity: Not on file  Other Topics Concern  . Not on file  Social History Narrative  . Not on file   No family history on file.  OBJECTIVE:  Vitals:   07/17/18 1345  BP: 134/72  Pulse: 76  Resp: 18  Temp: 97.6 F (36.4 C)  SpO2: 98%    General appearance: AOx3; in no acute distress; obese Head: NCAT Lungs: CTA bilaterally Heart: RRR.  Clear S1 and S2 without murmur, gallops, or rubs.  Radial pulses 2+ bilaterally. Musculoskeletal: Bilateral knees; exam limited due to body habitus Inspection: Skin warm, dry, clear and intact without obvious erythema, effusion, or ecchymosis; valgus deformity Palpation: Diffusely tender about bilateral knees; specifically to the lateral aspect of the right knee ROM: FROM  passively Strength: 5/5 knee abduction, 5/5 knee adduction, 5/5 knee flexion, 5/5 knee extension Stability: Anterior/ posterior drawer intact Skin: warm and dry Neurologic: Ambulates without difficulty; Sensation intact about the lower extremities Psychological: alert and cooperative; normal mood and affect  ASSESSMENT & PLAN:  1. Osteoarthritis of both knees, unspecified osteoarthritis type     Meds ordered this encounter  Medications  . naproxen (NAPROSYN) 500 MG tablet    Sig: Take 1 tablet (500 mg total) by mouth 2 (two) times daily.    Dispense:  30 tablet    Refill:  0    Order Specific Question:   Supervising Provider    Answer:   Wynona Luna [[id number]]   We will hold off on steroid today due to the potential side effects of prolonged use such as lowering your immune system, increased appetite, weight gain, osteoporosis, high blood pressure, muscle weakness, etc.. Brace place.  Use as needed for comfort Continue conservative management of rest, ice, and elevation Discontinue mobic We will try naproxen as needed for pain relief (may cause abdominal discomfort, ulcers, and GI bleeds avoid taking with other NSAIDs) PCP assistance initiated Follow up with orthopedist for further evaluation and management of chronic knee arthritis  Return or go to the ER if you have any new or worsening symptoms (fever, chills, chest pain, abdominal pain, changes in bowel or bladder habits, pain radiating into lower legs, etc...)   Reviewed expectations re: course of current medical issues. Questions answered. Outlined signs and symptoms indicating need for more acute intervention. Patient verbalized understanding. After Visit Summary given.    Lestine Box, PA-C 07/17/18 1500

## 2018-08-01 ENCOUNTER — Encounter (HOSPITAL_COMMUNITY): Payer: Self-pay | Admitting: Emergency Medicine

## 2018-08-01 ENCOUNTER — Other Ambulatory Visit: Payer: Self-pay

## 2018-08-01 ENCOUNTER — Ambulatory Visit (HOSPITAL_COMMUNITY)
Admission: EM | Admit: 2018-08-01 | Discharge: 2018-08-01 | Disposition: A | Discharge status: Home / Self Care | Payer: Medicare Other | Attending: Family Medicine | Admitting: Family Medicine

## 2018-08-01 DIAGNOSIS — G8929 Other chronic pain: Secondary | ICD-10-CM

## 2018-08-01 DIAGNOSIS — M1712 Unilateral primary osteoarthritis, left knee: Secondary | ICD-10-CM

## 2018-08-01 DIAGNOSIS — M25562 Pain in left knee: Secondary | ICD-10-CM

## 2018-08-01 DIAGNOSIS — M25561 Pain in right knee: Secondary | ICD-10-CM | POA: Diagnosis not present

## 2018-08-01 DIAGNOSIS — M1711 Unilateral primary osteoarthritis, right knee: Secondary | ICD-10-CM

## 2018-08-01 MED ORDER — METHYLPREDNISOLONE ACETATE 80 MG/ML IJ SUSP
INTRAMUSCULAR | Status: AC
  Filled 2018-08-01: qty 2

## 2018-08-01 NOTE — ED Triage Notes (Addendum)
Pt here for bilateral feet swelling in the last two weeks.  Pt has chronic arthritis in her knees and was prescribed Naproxen on October 17.  She states that it has not been helping.  Pt states her Medicaid is not valid at this time so she is not able to obtain a PCP or see orthopedics.

## 2018-08-01 NOTE — ED Provider Notes (Signed)
[place]    CSN: [id number] Arrival date & time: 08/01/18  1441     History   Chief Complaint Chief Complaint  Patient presents with  . Leg Swelling    HPI Debra Cooper is a 42 y.o. female.   HPI  Patient has bilateral known osteoarthritis of the knees.  She moved here from [place].  When she lived in [place] she received a cortisone injections in her knees every 3 to 6 months.  This controlled her pain and allowed her to work.  Since she is been down here, she does not have a PCP or orthopedic.  She is having trouble getting her insurance transferred.  She has been to the urgent care center a couple of times now and is been tried on 2 or 3 different anti-inflammatories.  None of them are helping her.  She tried to work for past few weeks but had to quit because of the knee pain.  Is been no new injury.  They are warm.  Swollen.  Very painful.  Crepitus.  No instability or buckling. Patient has lupus with multiple joints that become painful.  Rash.  Fatigue.  She also needs a rheumatologist.  Past Medical History:  Diagnosis Date  . Hypertension   . Lupus ([place])     There are no active problems to display for this patient.   History reviewed. No pertinent surgical history.  OB History   None      Home Medications    Prior to Admission medications   Medication Sig Start Date End Date Taking? Authorizing Provider  chlorthalidone (HYGROTON) 25 MG tablet Take 25 mg daily by mouth.   Yes [provider]  dicyclomine (BENTYL) 20 MG tablet Take 20 mg every 6 (six) hours by mouth.   Yes [provider]  fluticasone (FLONASE) 50 MCG/ACT nasal spray Place 1 spray daily into both nostrils. 08/10/17  Yes Jacqualine Mau, NP  hydroxychloroquine (PLAQUENIL) 200 MG tablet Take daily by mouth.   Yes [provider]  labetalol (NORMODYNE) 100 MG tablet Take 100 mg 2 (two) times daily by mouth.   Yes [provider]    losartan (COZAAR) 100 MG tablet Take 100 mg by mouth daily.   Yes [provider]  naproxen (NAPROSYN) 500 MG tablet Take 1 tablet (500 mg total) by mouth 2 (two) times daily. 07/17/18  Yes [place], [place], PA-C  Nutritional Supplements (EQUATE PO) Take by mouth.   Yes [provider]  omeprazole (PRILOSEC) 40 MG capsule Take 40 mg daily by mouth.   Yes [provider]  tobramycin-dexamethasone (TOBRADEX) ophthalmic solution Place 1 drop every 4 (four) hours while awake into both eyes. 08/10/17  Yes Jacqualine Mau, NP    Family History History reviewed. No pertinent family history.  Social History Social History   Tobacco Use  . Smoking status: Never Smoker  . Smokeless tobacco: Never Used  Substance Use Topics  . Alcohol use: No    Frequency: Never  . Drug use: No     Allergies   Patient has no known allergies.   Review of Systems Review of Systems  Constitutional: Negative for chills and fever.  HENT: Negative for ear pain and sore throat.   Eyes: Negative for pain and visual disturbance.  Respiratory: Negative for cough and shortness of breath.   Cardiovascular: Negative for chest pain and palpitations.  Gastrointestinal: Negative for abdominal pain and vomiting.  Genitourinary: Negative for dysuria and hematuria.  Musculoskeletal: Positive for arthralgias and gait problem. Negative for back pain.  Skin: Negative for color change and rash.  Neurological: Negative for seizures and syncope.  All other systems reviewed and are negative.    Physical Exam Triage Vital Signs ED Triage Vitals  Enc Vitals Group     BP 08/01/18 1536 (!) 142/74     Pulse Rate 08/01/18 1536 78     Resp --      Temp 08/01/18 1536 97.9 F (36.6 C)     Temp Source 08/01/18 1536 Oral     SpO2 08/01/18 1536 100 %     Weight --      Height --      Head Circumference --      Peak Flow --      Pain Score 08/01/18 1535 10     Pain Loc --      Pain Edu? --       Excl. in [place]? --    No data found.  Updated Vital Signs BP (!) 142/74 (BP Location: Left Arm)   Pulse 78   Temp 97.9 F (36.6 C) (Oral)   SpO2 100%      Physical Exam  Constitutional: She appears well-developed and well-nourished. No distress.  Patient is small and overweight.  Valgus knees.  Very antalgic gait  HENT:  Head: Normocephalic and atraumatic.  Mouth/Throat: Oropharynx is clear and moist.  Eyes: Pupils are equal, round, and reactive to light. Conjunctivae are normal.  Neck: Normal range of motion.  Cardiovascular: Normal rate.  Pulmonary/Chest: Effort normal. No respiratory distress.  Abdominal: Soft. She exhibits no distension.  Musculoskeletal: Normal range of motion. She exhibits no edema.  Both knees have limited range of motion.  Crepitus.  Slight effusion.  Left greater than right.  Medial joint line tenderness.  Neurological: She is alert.  Skin: Skin is warm and dry.  Psychiatric: She has a normal mood and affect. Her behavior is normal.     UC Treatments / Results  Labs (all labs ordered are listed, but only abnormal results are displayed) Labs Reviewed - No data to display  EKG None  Radiology No results found.  Procedures Join Aspiration/Injection Date/Time: 08/01/2018 9:17 PM Performed by: Raylene Everts, MD Authorized by: Raylene Everts, MD   Consent:    Consent obtained:  Verbal   Risks discussed:  Pain and infection   Alternatives discussed:  No treatment and alternative treatment Location:    Location:  Knee   Knee joint: biLateral. Anesthesia (see MAR for exact dosages):    Anesthesia method:  Local infiltration   Local anesthetic:  Lidocaine 1% w/o epi Procedure details:    Approach:  Anterior   Steroid injected: yes     Specimen collected: no   Post-procedure details:    Dressing:  Adhesive bandage   Patient tolerance of procedure:  Tolerated well, no immediate complications Comments:     Each knee was prepped  and draped in the usual fashion.  Anesthetized with 1 cc lidocaine well over the lateral joint line.  80 mg Depo-Medrol with 3 cc of 1% lidocaine, plain, injected into each knee without difficulty.  Postinjection care as discussed.    Medications Ordered in UC Medications - No data to display  Initial Impression / Assessment and Plan / UC Course  I have reviewed the triage vital signs and the nursing notes.  Pertinent labs & imaging results that were available during my care of the patient  were reviewed by me and considered in my medical decision making (see chart for details).     She was the best clinical response from her knee injections.  She is given knee injections today.  This will hopefully last her until she can get her insurance and get in with an orthopedist.  She can stop the Naprosyn. Final Clinical Impressions(s) / UC Diagnoses   Final diagnoses:  Bilateral chronic knee pain  Arthritis of left knee  Arthritis of knee, right     Discharge Instructions     Need ice for 20 min tonight and again a couple of times tomorrow Elevate legs Stop salt See your orthopedic as soon as you are able   ED Prescriptions    None     Controlled Substance Prescriptions [place] Controlled Substance Registry consulted? Not Applicable   Raylene Everts, MD 08/01/18 2119

## 2018-08-01 NOTE — Discharge Instructions (Signed)
Need ice for 20 min tonight and again a couple of times tomorrow Elevate legs Stop salt See your orthopedic as soon as you are able

## 2018-10-16 ENCOUNTER — Ambulatory Visit (HOSPITAL_COMMUNITY)
Admission: EM | Admit: 2018-10-16 | Discharge: 2018-10-16 | Disposition: A | Discharge status: Home / Self Care | Payer: Medicare Other | Attending: Family Medicine | Admitting: Family Medicine

## 2018-10-16 ENCOUNTER — Encounter (HOSPITAL_COMMUNITY): Payer: Self-pay

## 2018-10-16 DIAGNOSIS — M25561 Pain in right knee: Secondary | ICD-10-CM | POA: Diagnosis not present

## 2018-10-16 DIAGNOSIS — M25562 Pain in left knee: Secondary | ICD-10-CM | POA: Diagnosis not present

## 2018-10-16 DIAGNOSIS — G8929 Other chronic pain: Secondary | ICD-10-CM | POA: Insufficient documentation

## 2018-10-16 MED ORDER — METRONIDAZOLE 500 MG PO TABS: 500.0000 mg | ORAL_TABLET | Freq: Two times a day (BID) | ORAL | 0 refills | Status: DC

## 2018-10-16 MED ORDER — FLUCONAZOLE 150 MG PO TABS: 150.0000 mg | ORAL_TABLET | Freq: Every day | ORAL | 0 refills | Status: DC

## 2018-10-16 MED ORDER — METHYLPREDNISOLONE 4 MG PO TBPK: ORAL_TABLET | ORAL | 0 refills | Status: DC

## 2018-10-16 NOTE — ED Notes (Signed)
This RN went in to d/c patient and patient states "you guys never addressed my vaginal itching".  Will make Dr. Meda Coffee aware.

## 2018-10-16 NOTE — ED Triage Notes (Signed)
Pt presents with complaints of knee pain after doing a lot of walking. She has history of arthritis. Pt also complains of vaginal itching x 1 month.

## 2018-10-16 NOTE — Discharge Instructions (Signed)
Take the prednisone (medrol)  as directed See an orthopedic as soon as you are able

## 2018-10-16 NOTE — ED Provider Notes (Signed)
[facility]    CSN: [id number] Arrival date & time: 10/16/18  1628     History   Chief Complaint Chief Complaint  Patient presents with  . Knee Pain    HPI Debra Cooper is a 43 y.o. female.   HPI  Patient is here with 2 complaints.  First she has chronic bilateral knee pain.  She has end-stage guarding the right is in her knees.  She still does not have insurance approval to go see a primary care doctor or an orthopedic.  She is here requesting have injections in her knees.  She states the only thing that helps.  I reviewed her medical record.  I saw her early November and gave her injections.  It is not yet been 3 months.  I told her not comfortable repeating her injections in a shorter timeframe than 3 months.  In addition I believe she needs to see an orthopedist prior to additional injections.  I will give her a Medrol Dosepak to see if this helps her. She also complains of vaginal itching and discharge.  She states she is not sexually active.  She is not worried about STD.  She has had a history of yeast infections and BV.  She declines odor.  I will treat her for both given her past history.  Past Medical History:  Diagnosis Date  . Hypertension   . Lupus ([place])     There are no active problems to display for this patient.   History reviewed. No pertinent surgical history.  OB History   No obstetric history on file.      Home Medications    Prior to Admission medications   Medication Sig Start Date End Date Taking? Authorizing Provider  chlorthalidone (HYGROTON) 25 MG tablet Take 25 mg daily by mouth.    [provider]  dicyclomine (BENTYL) 20 MG tablet Take 20 mg every 6 (six) hours by mouth.    [provider]  fluconazole (DIFLUCAN) 150 MG tablet Take 1 tablet (150 mg total) by mouth daily. Repeat in 1 week if needed 10/16/18   Raylene Everts, MD  fluticasone [facility]) 50 MCG/ACT nasal spray Place 1 spray daily into both  nostrils. 08/10/17   Jacqualine Mau, NP  hydroxychloroquine (PLAQUENIL) 200 MG tablet Take daily by mouth.    [provider]  labetalol (NORMODYNE) 100 MG tablet Take 100 mg 2 (two) times daily by mouth.    [provider]  losartan (COZAAR) 100 MG tablet Take 100 mg by mouth daily.    [provider]  methylPREDNISolone (MEDROL DOSEPAK) 4 MG TBPK tablet tad 10/16/18   Raylene Everts, MD  metroNIDAZOLE (FLAGYL) 500 MG tablet Take 1 tablet (500 mg total) by mouth 2 (two) times daily. 10/16/18   Raylene Everts, MD  Nutritional Supplements (EQUATE PO) Take by mouth.    [provider]  omeprazole (PRILOSEC) 40 MG capsule Take 40 mg daily by mouth.    [provider]  tobramycin-dexamethasone [facility]) ophthalmic solution Place 1 drop every 4 (four) hours while awake into both eyes. 08/10/17   Jacqualine Mau, NP    Family History Family History  Problem Relation Age of Onset  . Hypertension Mother   . Hypertension Father   . Heart failure Father   . Cancer Father     Social History Social History   Tobacco Use  . Smoking status: Never Smoker  . Smokeless tobacco: Never Used  Substance  Use Topics  . Alcohol use: No    Frequency: Never  . Drug use: No     Allergies   Patient has no known allergies.   Review of Systems Review of Systems  Constitutional: Negative for chills and fever.  HENT: Negative for ear pain and sore throat.   Eyes: Negative for pain and visual disturbance.  Respiratory: Negative for cough and shortness of breath.   Cardiovascular: Negative for chest pain and palpitations.  Gastrointestinal: Negative for abdominal pain and vomiting.  Genitourinary: Positive for vaginal discharge. Negative for dysuria, hematuria, vaginal bleeding and vaginal pain.  Musculoskeletal: Positive for arthralgias, gait problem and joint swelling. Negative for back pain.  Skin: Negative for color change and rash.    Neurological: Negative for seizures and syncope.  All other systems reviewed and are negative.    Physical Exam Triage Vital Signs ED Triage Vitals  Enc Vitals Group     BP 10/16/18 1703 (!) 126/50     Pulse Rate 10/16/18 1703 79     Resp 10/16/18 1703 18     Temp 10/16/18 1703 97.9 F (36.6 C)     Temp src --      SpO2 10/16/18 1703 100 %     Weight --      Height --      Head Circumference --      Peak Flow --      Pain Score 10/16/18 1701 10     Pain Loc --      Pain Edu? --      Excl. in [place]? --    No data found.  Updated Vital Signs BP (!) 126/50   Pulse 79   Temp 97.9 F (36.6 C)   Resp 18   SpO2 100%  Physical Exam Constitutional:      General: She is not in acute distress.    Appearance: She is well-developed. She is obese.  HENT:     Head: Normocephalic and atraumatic.  Eyes:     Conjunctiva/sclera: Conjunctivae normal.     Pupils: Pupils are equal, round, and reactive to light.  Neck:     Musculoskeletal: Normal range of motion.  Cardiovascular:     Rate and Rhythm: Normal rate.  Pulmonary:     Effort: Pulmonary effort is normal. No respiratory distress.  Abdominal:     General: There is no distension.     Palpations: Abdomen is soft.  Musculoskeletal: Normal range of motion.     Comments: Morbidly obese.  Valgus knees.  Knees have warmth and crepitus.  No instability.  Skin:    General: Skin is warm and dry.  Neurological:     General: No focal deficit present.     Mental Status: She is alert.  Psychiatric:        Mood and Affect: Mood normal.      UC Treatments / Results  Labs (all labs ordered are listed, but only abnormal results are displayed) Labs Reviewed - No data to display  EKG None  Radiology No results found.  Procedures Procedures (including critical care time)  Medications Ordered in UC Medications - No data to display  Initial Impression / Assessment and Plan / UC Course  I have reviewed the triage vital  signs and the nursing notes.  Pertinent labs & imaging results that were available during my care of the patient were reviewed by me and considered in my medical decision making (see chart for details).  Known end-stage osteoarthritis.  Patient has not obtained appropriate care with an orthopedic.  It is too soon to do injections.  I will give her a Medrol Dosepak. She states that she is not sexually active and not worried about an STD.  She has recurring BV and yeast infections.  I will treat her for both. Final Clinical Impressions(s) / UC Diagnoses   Final diagnoses:  Chronic pain of both knees     Discharge Instructions     Take the prednisone (medrol)  as directed See an orthopedic as soon as you are able    ED Prescriptions    Medication Sig Dispense Auth. Provider   methylPREDNISolone (MEDROL DOSEPAK) 4 MG TBPK tablet tad 21 tablet Raylene Everts, MD   metroNIDAZOLE (FLAGYL) 500 MG tablet Take 1 tablet (500 mg total) by mouth 2 (two) times daily. 14 tablet Raylene Everts, MD   fluconazole (DIFLUCAN) 150 MG tablet Take 1 tablet (150 mg total) by mouth daily. Repeat in 1 week if needed 2 tablet Raylene Everts, MD     Controlled Substance Prescriptions [place] Controlled Substance Registry consulted? Not Applicable   Raylene Everts, MD 10/16/18 2108

## 2018-11-05 ENCOUNTER — Ambulatory Visit (HOSPITAL_COMMUNITY)
Admission: EM | Admit: 2018-11-05 | Discharge: 2018-11-05 | Disposition: A | Discharge status: Home / Self Care | Payer: Medicare Other | Attending: Family Medicine | Admitting: Family Medicine

## 2018-11-05 ENCOUNTER — Encounter (HOSPITAL_COMMUNITY): Payer: Self-pay | Admitting: Emergency Medicine

## 2018-11-05 DIAGNOSIS — M329 Systemic lupus erythematosus, unspecified: Secondary | ICD-10-CM

## 2018-11-05 MED ORDER — PREDNISONE 10 MG PO TABS: 20.0000 mg | ORAL_TABLET | Freq: Every day | ORAL | 0 refills | Status: DC

## 2018-11-05 NOTE — ED Provider Notes (Signed)
[place]    CSN: [id number] Arrival date & time: 11/05/18  [place]     History   Chief Complaint Chief Complaint  Patient presents with  . Medication Refill    HPI Debra Cooper is a 43 y.o. female.   Patient has history of lupus arthritis.  Has been here several times for steroids for her knees.  At last visit was given a Dosepak.  She apparently moved from [place] and is waiting for her Medicaid or Medicare insurance to be, active in [place] and does not have a primary care physician or rheumatologist.  She tells me she does not have other lupus symptoms HPI  Past Medical History:  Diagnosis Date  . Hypertension   . Lupus ([place])     There are no active problems to display for this patient.   History reviewed. No pertinent surgical history.  OB History   No obstetric history on file.      Home Medications    Prior to Admission medications   Medication Sig Start Date End Date Taking? Authorizing Provider  chlorthalidone (HYGROTON) 25 MG tablet Take 25 mg daily by mouth.    [provider]  dicyclomine (BENTYL) 20 MG tablet Take 20 mg every 6 (six) hours by mouth.    [provider]  fluconazole (DIFLUCAN) 150 MG tablet Take 1 tablet (150 mg total) by mouth daily. Repeat in 1 week if needed 10/16/18   Raylene Everts, MD  fluticasone [facility]) 50 MCG/ACT nasal spray Place 1 spray daily into both nostrils. 08/10/17   Jacqualine Mau, NP  hydroxychloroquine (PLAQUENIL) 200 MG tablet Take daily by mouth.    [provider]  labetalol (NORMODYNE) 100 MG tablet Take 100 mg 2 (two) times daily by mouth.    [provider]  losartan (COZAAR) 100 MG tablet Take 100 mg by mouth daily.    [provider]  methylPREDNISolone (MEDROL DOSEPAK) 4 MG TBPK tablet tad 10/16/18   Raylene Everts, MD  metroNIDAZOLE (FLAGYL) 500 MG tablet Take 1 tablet (500 mg total) by mouth 2 (two) times daily. 10/16/18   Raylene Everts, MD  Nutritional Supplements (EQUATE PO) Take by mouth.    [provider]  omeprazole (PRILOSEC) 40 MG capsule Take 40 mg daily by mouth.    [provider]  predniSONE (DELTASONE) 10 MG tablet Take 2 tablets (20 mg total) by mouth daily. Take 2 tab x 3 days, then 1 qd until  gone 11/05/18   Wardell Honour, MD  tobramycin-dexamethasone [facility]) ophthalmic solution Place 1 drop every 4 (four) hours while awake into both eyes. 08/10/17   Jacqualine Mau, NP    Family History Family History  Problem Relation Age of Onset  . Hypertension Mother   . Hypertension Father   . Heart failure Father   . Cancer Father     Social History Social History   Tobacco Use  . Smoking status: Never Smoker  . Smokeless tobacco: Never Used  Substance Use Topics  . Alcohol use: No    Frequency: Never  . Drug use: No     Allergies   Patient has no known allergies.   Review of Systems Review of Systems  Musculoskeletal: Positive for arthralgias and joint swelling.  All other systems reviewed and are negative.    Physical Exam Triage Vital Signs ED Triage Vitals  Enc Vitals Group     BP 11/05/18 1802 (!) 141/75  Pulse Rate 11/05/18 1802 70     Resp 11/05/18 1802 18     Temp 11/05/18 1802 97.8 F (36.6 C)     Temp Source 11/05/18 1802 Temporal     SpO2 11/05/18 1802 100 %     Weight --      Height --      Head Circumference --      Peak Flow --      Pain Score 11/05/18 1803 5     Pain Loc --      Pain Edu? --      Excl. in [place]? --    No data found.  Updated Vital Signs BP (!) 141/75 (BP Location: Right Wrist)   Pulse 70   Temp 97.8 F (36.6 C) (Temporal)   Resp 18   SpO2 100%   Visual Acuity Right Eye Distance:   Left Eye Distance:   Bilateral Distance:    Right Eye Near:   Left Eye Near:    Bilateral Near:     Physical Exam Constitutional:      Appearance: Normal appearance.  Cardiovascular:     Rate and Rhythm: Regular  rhythm.  Pulmonary:     Effort: Pulmonary effort is normal.  Musculoskeletal:     Comments: Knees are tender to palpation there is no crepitus with extension and flexion.  I do believe there is some effusion bilaterally  Neurological:     Mental Status: She is alert.      UC Treatments / Results  Labs (all labs ordered are listed, but only abnormal results are displayed) Labs Reviewed - No data to display  EKG None  Radiology No results found.  Procedures Procedures (including critical care time)  Medications Ordered in UC Medications - No data to display  Initial Impression / Assessment and Plan / UC Course  I have reviewed the triage vital signs and the nursing notes.  Pertinent labs & imaging results that were available during my care of the patient were reviewed by me and considered in my medical decision making (see chart for details).     Lupus arthritis Final Clinical Impressions(s) / UC Diagnoses   Final diagnoses:  Lupus arthritis [facility])   Discharge Instructions   None    ED Prescriptions    Medication Sig Dispense Auth. Provider   predniSONE (DELTASONE) 10 MG tablet Take 2 tablets (20 mg total) by mouth daily. Take 2 tab x 3 days, then 1 qd until  gone 15 tablet Wardell Honour, MD     Controlled Substance Prescriptions [place] Controlled Substance Registry consulted? No   Wardell Honour, MD 11/05/18 Bosie Helper

## 2018-11-05 NOTE — ED Triage Notes (Signed)
Pt states she is out of the prescription of steroids she was provided last month for her bilateral knee pain.  States she is here for a refill.  Denies changes in condition, is trying to be preemptive.

## 2018-11-06 ENCOUNTER — Telehealth (HOSPITAL_COMMUNITY): Payer: Self-pay | Admitting: Emergency Medicine

## 2018-11-06 MED ORDER — PREDNISONE 10 MG PO TABS: 20.0000 mg | ORAL_TABLET | Freq: Every day | ORAL | 0 refills | Status: DC

## 2018-11-06 NOTE — Telephone Encounter (Signed)
Pharmacy change

## 2018-12-08 ENCOUNTER — Ambulatory Visit (HOSPITAL_COMMUNITY)
Admission: EM | Admit: 2018-12-08 | Discharge: 2018-12-08 | Disposition: A | Discharge status: Home / Self Care | Payer: Medicare Other | Attending: Internal Medicine | Admitting: Internal Medicine

## 2018-12-08 ENCOUNTER — Encounter (HOSPITAL_COMMUNITY): Payer: Self-pay | Admitting: Emergency Medicine

## 2018-12-08 ENCOUNTER — Telehealth (HOSPITAL_COMMUNITY): Payer: Self-pay | Admitting: Emergency Medicine

## 2018-12-08 ENCOUNTER — Other Ambulatory Visit: Payer: Self-pay

## 2018-12-08 DIAGNOSIS — M25561 Pain in right knee: Secondary | ICD-10-CM

## 2018-12-08 DIAGNOSIS — I10 Essential (primary) hypertension: Secondary | ICD-10-CM | POA: Diagnosis not present

## 2018-12-08 DIAGNOSIS — Z760 Encounter for issue of repeat prescription: Secondary | ICD-10-CM | POA: Diagnosis not present

## 2018-12-08 DIAGNOSIS — M25562 Pain in left knee: Secondary | ICD-10-CM

## 2018-12-08 DIAGNOSIS — M329 Systemic lupus erythematosus, unspecified: Secondary | ICD-10-CM

## 2018-12-08 MED ORDER — CHLORTHALIDONE 25 MG PO TABS: 25.0000 mg | ORAL_TABLET | Freq: Every day | ORAL | 0 refills | Status: DC

## 2018-12-08 MED ORDER — METHYLPREDNISOLONE SODIUM SUCC 125 MG IJ SOLR
80.0000 mg | Freq: Once | INTRAMUSCULAR | Status: AC
  Administered 2018-12-08: 80 mg via INTRAMUSCULAR

## 2018-12-08 MED ORDER — METHYLPREDNISOLONE SODIUM SUCC 125 MG IJ SOLR
INTRAMUSCULAR | Status: AC
  Filled 2018-12-08: qty 2

## 2018-12-08 NOTE — ED Triage Notes (Signed)
Requesting medication refill of chlorthalidone. Taking last dose today.

## 2018-12-08 NOTE — ED Provider Notes (Signed)
[place]    CSN: [id number] Arrival date & time: 12/08/18  1551     History   Chief Complaint Chief Complaint  Patient presents with  . Medication Refill    HPI Debra Cooper is a 43 y.o. female.   43 year old female with history of HTN, lupus comes in for medication refill of chlorthalidone. She has been on this medicine for many years, took last dose today. Has not had problems with medicine, states moved to this area and is waiting for medicare to cross over. Denies chest pain, shortness of breath, palpitations.      Past Medical History:  Diagnosis Date  . Hypertension   . Lupus ([place])     There are no active problems to display for this patient.   History reviewed. No pertinent surgical history.  OB History   No obstetric history on file.      Home Medications    Prior to Admission medications   Medication Sig Start Date End Date Taking? Authorizing Provider  hydroxychloroquine (PLAQUENIL) 200 MG tablet Take daily by mouth.   Yes [provider]  labetalol (NORMODYNE) 100 MG tablet Take 100 mg 2 (two) times daily by mouth.   Yes [provider]  losartan (COZAAR) 100 MG tablet Take 100 mg by mouth daily.   Yes [provider]  omeprazole (PRILOSEC) 40 MG capsule Take 40 mg daily by mouth.   Yes [provider]  predniSONE (DELTASONE) 10 MG tablet Take 2 tablets (20 mg total) by mouth daily. Take 2 tab x 3 days, then 1 qd until  gone 11/06/18  Yes Wardell Honour, MD  chlorthalidone (HYGROTON) 25 MG tablet Take 1 tablet (25 mg total) by mouth daily. 12/08/18   Tasia Catchings, Shalunda Lindh V, PA-C  dicyclomine (BENTYL) 20 MG tablet Take 20 mg every 6 (six) hours by mouth.    [provider]  fluconazole (DIFLUCAN) 150 MG tablet Take 1 tablet (150 mg total) by mouth daily. Repeat in 1 week if needed 10/16/18   Raylene Everts, MD  fluticasone [facility]) 50 MCG/ACT nasal spray Place 1 spray daily into both nostrils. 08/10/17    Jacqualine Mau, NP    Family History Family History  Problem Relation Age of Onset  . Hypertension Mother   . Hypertension Father   . Heart failure Father   . Cancer Father     Social History Social History   Tobacco Use  . Smoking status: Never Smoker  . Smokeless tobacco: Never Used  Substance Use Topics  . Alcohol use: No    Frequency: Never  . Drug use: No     Allergies   Patient has no known allergies.   Review of Systems Review of Systems  Reason unable to perform ROS: See HPI as above.     Physical Exam Triage Vital Signs ED Triage Vitals  Enc Vitals Group     BP 12/08/18 1650 (!) 127/58     Pulse Rate 12/08/18 1650 83     Resp 12/08/18 1650 18     Temp 12/08/18 1650 98 F (36.7 C)     Temp Source 12/08/18 1650 Temporal     SpO2 12/08/18 1650 97 %     Weight --      Height --      Head Circumference --      Peak Flow --      Pain Score 12/08/18 1645 0     Pain Loc --  Pain Edu? --      Excl. in [place]? --    No data found.  Updated Vital Signs BP (!) 127/58 (BP Location: Right Arm) Comment: large cuff  Pulse 83   Temp 98 F (36.7 C) (Temporal)   Resp 18   SpO2 97%   Physical Exam Constitutional:      General: She is not in acute distress.    Appearance: She is well-developed. She is not ill-appearing, toxic-appearing or diaphoretic.  HENT:     Head: Normocephalic and atraumatic.  Eyes:     Conjunctiva/sclera: Conjunctivae normal.     Pupils: Pupils are equal, round, and reactive to light.  Cardiovascular:     Rate and Rhythm: Normal rate and regular rhythm.     Heart sounds: No murmur. No friction rub. No gallop.   Pulmonary:     Effort: Pulmonary effort is normal. No respiratory distress.     Breath sounds: Normal breath sounds. No stridor. No wheezing, rhonchi or rales.  Musculoskeletal:     Right lower leg: No edema.     Left lower leg: No edema.  Neurological:     Mental Status: She is alert and oriented to person,  place, and time.      UC Treatments / Results  Labs (all labs ordered are listed, but only abnormal results are displayed) Labs Reviewed - No data to display  EKG None  Radiology No results found.  Procedures Procedures (including critical care time)  Medications Ordered in UC Medications  methylPREDNISolone sodium succinate (SOLU-MEDROL) 125 mg/2 mL injection 80 mg (has no administration in time range)    Initial Impression / Assessment and Plan / UC Course  I have reviewed the triage vital signs and the nursing notes.  Pertinent labs & imaging results that were available during my care of the patient were reviewed by me and considered in my medical decision making (see chart for details).    Chlorthalidone refilled. PCP resources provided. Patient to follow up with PCP for further refills.  As discharging, patient requesting steroid, states "I have pain to my knees", "I have lupus". Will provide solumedrol injection. Patient to follow up with PCP for further evaluation needed.  Final Clinical Impressions(s) / UC Diagnoses   Final diagnoses:  Medication refill    ED Prescriptions    Medication Sig Dispense Auth. Provider   chlorthalidone (HYGROTON) 25 MG tablet Take 1 tablet (25 mg total) by mouth daily. [address] tablet Tobin Chad, [place] 12/08/18 1717

## 2018-12-08 NOTE — Discharge Instructions (Signed)
Blood pressure medicine refilled. Follow up with PCP for further evaluation needed.

## 2018-12-18 ENCOUNTER — Encounter (HOSPITAL_COMMUNITY): Payer: Self-pay | Admitting: Emergency Medicine

## 2018-12-18 ENCOUNTER — Other Ambulatory Visit: Payer: Self-pay

## 2018-12-18 ENCOUNTER — Ambulatory Visit (HOSPITAL_COMMUNITY)
Admission: EM | Admit: 2018-12-18 | Discharge: 2018-12-18 | Disposition: A | Discharge status: Home / Self Care | Payer: Medicare Other | Attending: Family Medicine | Admitting: Family Medicine

## 2018-12-18 DIAGNOSIS — M19071 Primary osteoarthritis, right ankle and foot: Secondary | ICD-10-CM | POA: Insufficient documentation

## 2018-12-18 DIAGNOSIS — M329 Systemic lupus erythematosus, unspecified: Secondary | ICD-10-CM | POA: Diagnosis not present

## 2018-12-18 DIAGNOSIS — N760 Acute vaginitis: Secondary | ICD-10-CM | POA: Diagnosis not present

## 2018-12-18 DIAGNOSIS — M170 Bilateral primary osteoarthritis of knee: Secondary | ICD-10-CM | POA: Diagnosis not present

## 2018-12-18 MED ORDER — FLUCONAZOLE 150 MG PO TABS: 150.0000 mg | ORAL_TABLET | Freq: Every day | ORAL | 0 refills | Status: DC

## 2018-12-18 MED ORDER — PREDNISONE 10 MG PO TABS: 10.0000 mg | ORAL_TABLET | Freq: Every day | ORAL | 0 refills | Status: DC

## 2018-12-18 MED ORDER — METRONIDAZOLE 500 MG PO TABS: 500.0000 mg | ORAL_TABLET | Freq: Two times a day (BID) | ORAL | 0 refills | Status: DC

## 2018-12-18 NOTE — ED Provider Notes (Signed)
[place]    CSN: [id number] Arrival date & time: 12/18/18  1623     History   Chief Complaint Chief Complaint  Patient presents with   Leg Pain   Vaginal Itching    HPI Debra Cooper is a 43 y.o. female.   43 yo woman who presents for evaluation of knee pain and vaginal irritation.  She never got her prescription filled back in January.  She can just have irritation at the opening of the vaginal area.  She has chronic diarrhea and thinks that may be contributing to her recurrent BV.  She has a fishy odor.  She recently had intercourse with a steady partner and the condom broke.  Therefore she would like STD testing.  Patient also has bilateral knee pain.  She has a appointment with an orthopedist in 10 days. Last STD screen in chart:  September 2019.  Patient lives with her sister.  Patient moved down from [facility] a few months ago.  She is disabled.  Note from 9 days ago: 43 year old female with history of HTN, lupus comes in for medication refill of chlorthalidone. She has been on this medicine for many years, took last dose today. Has not had problems with medicine, states moved to this area and is waiting for medicare to cross over. Denies chest pain, shortness of breath, palpitations.   Note from 10/16/2018: Patient is here with 2 complaints.  First she has chronic bilateral knee pain.  She has end-stage guarding the right is in her knees.  She still does not have insurance approval to go see a primary care doctor or an orthopedic.  She is here requesting have injections in her knees.  She states the only thing that helps.  I reviewed her medical record.  I saw her early November and gave her injections.  It is not yet been 3 months.  I told her not comfortable repeating her injections in a shorter timeframe than 3 months.  In addition I believe she needs to see an orthopedist prior to additional injections.  I will give her a Medrol Dosepak to see  if this helps her. She also complains of vaginal itching and discharge.  She states she is not sexually active.  She is not worried about STD.  She has had a history of yeast infections and BV.  She declines odor.  I will treat her for both given her past history.     Past Medical History:  Diagnosis Date   Hypertension    Lupus ([place])     There are no active problems to display for this patient.   History reviewed. No pertinent surgical history.  OB History   No obstetric history on file.      Home Medications    Prior to Admission medications   Medication Sig Start Date End Date Taking? Authorizing Provider  chlorthalidone (HYGROTON) 25 MG tablet Take 1 tablet (25 mg total) by mouth daily. 12/08/18   Tasia Catchings, Amy V, PA-C  dicyclomine (BENTYL) 20 MG tablet Take 20 mg every 6 (six) hours by mouth.    [provider]  fluconazole (DIFLUCAN) 150 MG tablet Take 1 tablet (150 mg total) by mouth daily. Repeat in 1 week if needed 12/18/18   Robyn Haber, MD  fluticasone [facility]) 50 MCG/ACT nasal spray Place 1 spray daily into both nostrils. 08/10/17   Jacqualine Mau, NP  hydroxychloroquine (PLAQUENIL) 200 MG tablet Take daily by mouth.  [provider]  labetalol (NORMODYNE) 100 MG tablet Take 100 mg 2 (two) times daily by mouth.    [provider]  losartan (COZAAR) 100 MG tablet Take 100 mg by mouth daily.    [provider]  metroNIDAZOLE (FLAGYL) 500 MG tablet Take 1 tablet (500 mg total) by mouth 2 (two) times daily. 12/18/18   Robyn Haber, MD  omeprazole (PRILOSEC) 40 MG capsule Take 40 mg daily by mouth.    [provider]  predniSONE (DELTASONE) 10 MG tablet Take 1 tablet (10 mg total) by mouth daily with breakfast. 12/18/18   Robyn Haber, MD    Family History Family History  Problem Relation Age of Onset   Hypertension Mother    Hypertension Father    Heart failure Father    Cancer Father     Social  History Social History   Tobacco Use   Smoking status: Never Smoker   Smokeless tobacco: Never Used  Substance Use Topics   Alcohol use: No    Frequency: Never   Drug use: No     Allergies   Patient has no known allergies.   Review of Systems Review of Systems  Constitutional: Negative.   Genitourinary: Positive for vaginal discharge and vaginal pain.  Musculoskeletal: Positive for arthralgias, gait problem and joint swelling.  Skin: Negative.      Physical Exam Triage Vital Signs ED Triage Vitals  Enc Vitals Group     BP      Pulse      Resp      Temp      Temp src      SpO2      Weight      Height      Head Circumference      Peak Flow      Pain Score      Pain Loc      Pain Edu?      Excl. in [place]?    No data found.  Updated Vital Signs BP (!) 152/108    Pulse 92    Temp 98.6 F (37 C) (Oral)    Resp 16    SpO2 99%   Physical Exam Vitals signs and nursing note reviewed.  Constitutional:      Appearance: Normal appearance. She is obese.  HENT:     Head: Normocephalic.     Mouth/Throat:     Mouth: Mucous membranes are moist.  Eyes:     Conjunctiva/sclera: Conjunctivae normal.  Neck:     Musculoskeletal: Normal range of motion and neck supple.  Cardiovascular:     Rate and Rhythm: Normal rate.     Pulses: Normal pulses.  Pulmonary:     Effort: Pulmonary effort is normal.  Musculoskeletal:        General: Swelling present. No deformity or signs of injury.     Right lower leg: Edema present.     Left lower leg: Edema present.  Skin:    General: Skin is warm and dry.  Neurological:     General: No focal deficit present.     Mental Status: She is alert and oriented to person, place, and time.  Psychiatric:        Mood and Affect: Mood normal.        Thought Content: Thought content normal.        Judgment: Judgment normal.      UC Treatments / Results  Labs (all labs ordered are listed, but only  abnormal results are displayed) Labs  Reviewed  CERVICOVAGINAL ANCILLARY ONLY    EKG None  Radiology No results found.  Procedures Procedures (including critical care time)  Medications Ordered in UC Medications - No data to display  Initial Impression / Assessment and Plan / UC Course  I have reviewed the triage vital signs and the nursing notes.  Pertinent labs & imaging results that were available during my care of the patient were reviewed by me and considered in my medical decision making (see chart for details).    Final Clinical Impressions(s) / UC Diagnoses   Final diagnoses:  Vaginitis and vulvovaginitis  Primary osteoarthritis of both knees  Arthritis of right ankle  Lupus [facility])   Discharge Instructions   None    ED Prescriptions    Medication Sig Dispense Auth. Provider   fluconazole (DIFLUCAN) 150 MG tablet Take 1 tablet (150 mg total) by mouth daily. Repeat in 1 week if needed 2 tablet Robyn Haber, MD   metroNIDAZOLE (FLAGYL) 500 MG tablet Take 1 tablet (500 mg total) by mouth 2 (two) times daily. 14 tablet Robyn Haber, MD   predniSONE (DELTASONE) 10 MG tablet Take 1 tablet (10 mg total) by mouth daily with breakfast. 14 tablet Robyn Haber, MD     Controlled Substance Prescriptions [place] Controlled Substance Registry consulted? Not Applicable   Robyn Haber, MD 12/18/18 1655

## 2018-12-18 NOTE — ED Triage Notes (Signed)
PT reports pain in both legs. PT has arthritis and lupus. Pain 10/10  Vaginal itching for 2-3 weeks

## 2018-12-20 LAB — CERVICOVAGINAL ANCILLARY ONLY
Chlamydia: NEGATIVE
Neisseria Gonorrhea: NEGATIVE
Trichomonas: NEGATIVE

## 2018-12-29 DIAGNOSIS — Z0000 Encounter for general adult medical examination without abnormal findings: Secondary | ICD-10-CM | POA: Diagnosis not present

## 2018-12-29 DIAGNOSIS — J302 Other seasonal allergic rhinitis: Secondary | ICD-10-CM | POA: Diagnosis not present

## 2018-12-29 DIAGNOSIS — R7303 Prediabetes: Secondary | ICD-10-CM | POA: Diagnosis not present

## 2018-12-29 DIAGNOSIS — M179 Osteoarthritis of knee, unspecified: Secondary | ICD-10-CM | POA: Diagnosis not present

## 2018-12-29 DIAGNOSIS — I10 Essential (primary) hypertension: Secondary | ICD-10-CM | POA: Diagnosis not present

## 2018-12-29 DIAGNOSIS — Z6841 Body Mass Index (BMI) 40.0 and over, adult: Secondary | ICD-10-CM | POA: Diagnosis not present

## 2018-12-29 DIAGNOSIS — M3210 Systemic lupus erythematosus, organ or system involvement unspecified: Secondary | ICD-10-CM | POA: Diagnosis not present

## 2019-01-07 DIAGNOSIS — M179 Osteoarthritis of knee, unspecified: Secondary | ICD-10-CM | POA: Diagnosis not present

## 2019-01-07 DIAGNOSIS — G894 Chronic pain syndrome: Secondary | ICD-10-CM | POA: Diagnosis not present

## 2019-01-07 DIAGNOSIS — Z6841 Body Mass Index (BMI) 40.0 and over, adult: Secondary | ICD-10-CM | POA: Diagnosis not present

## 2019-01-07 DIAGNOSIS — M3210 Systemic lupus erythematosus, organ or system involvement unspecified: Secondary | ICD-10-CM | POA: Diagnosis not present

## 2019-01-07 DIAGNOSIS — I10 Essential (primary) hypertension: Secondary | ICD-10-CM | POA: Diagnosis not present

## 2019-01-07 DIAGNOSIS — R7303 Prediabetes: Secondary | ICD-10-CM | POA: Diagnosis not present

## 2019-01-30 DIAGNOSIS — M3210 Systemic lupus erythematosus, organ or system involvement unspecified: Secondary | ICD-10-CM | POA: Diagnosis not present

## 2019-01-30 DIAGNOSIS — R7303 Prediabetes: Secondary | ICD-10-CM | POA: Diagnosis not present

## 2019-01-30 DIAGNOSIS — G894 Chronic pain syndrome: Secondary | ICD-10-CM | POA: Diagnosis not present

## 2019-01-30 DIAGNOSIS — I10 Essential (primary) hypertension: Secondary | ICD-10-CM | POA: Diagnosis not present

## 2019-02-24 ENCOUNTER — Encounter (HOSPITAL_COMMUNITY): Payer: Self-pay | Admitting: Emergency Medicine

## 2019-02-24 ENCOUNTER — Other Ambulatory Visit: Payer: Self-pay

## 2019-02-24 ENCOUNTER — Ambulatory Visit (HOSPITAL_COMMUNITY)
Admission: EM | Admit: 2019-02-24 | Discharge: 2019-02-24 | Disposition: A | Discharge status: Home / Self Care | Payer: Medicare Other | Attending: Family Medicine | Admitting: Family Medicine

## 2019-02-24 DIAGNOSIS — R21 Rash and other nonspecific skin eruption: Secondary | ICD-10-CM

## 2019-02-24 MED ORDER — PREDNISONE 20 MG PO TABS: 20.0000 mg | ORAL_TABLET | Freq: Two times a day (BID) | ORAL | 0 refills | Status: AC

## 2019-02-24 MED ORDER — CLOTRIMAZOLE 1 % EX CREA: TOPICAL_CREAM | CUTANEOUS | 0 refills | Status: DC

## 2019-02-24 MED ORDER — CETIRIZINE HCL 10 MG PO CAPS: 10.0000 mg | ORAL_CAPSULE | Freq: Every day | ORAL | 0 refills | Status: DC

## 2019-02-24 NOTE — ED Provider Notes (Signed)
[place]    CSN: [id number] Arrival date & time: 02/24/19  1615     History   Chief Complaint Chief Complaint  Patient presents with  . Rash    HPI Debra Cooper is a 43 y.o. female history of lupus, hypertension, presenting today for evaluation of a rash.  Patient states that over the past month she has had intermittent rash around her face and lips.  She states that her lips have felt very dry, with occasional cracking creases.  She has also had some occasional swelling and itching and burning sensation around her perioral area and has noticed some bumps breaking out in this area.  She states that she has had this in the past, and is intermittent.  Denies any new exposures of new foods, hygiene products, lotions or soaps.  Denies any new foods or medicines.  She has been applying Vaseline to her lips without relief.  Denies rash elsewhere on the body.  HPI  Past Medical History:  Diagnosis Date  . Hypertension   . Lupus ([place])     There are no active problems to display for this patient.   History reviewed. No pertinent surgical history.  OB History   No obstetric history on file.      Home Medications    Prior to Admission medications   Medication Sig Start Date End Date Taking? Authorizing Provider  Cetirizine HCl 10 MG CAPS Take 1 capsule (10 mg total) by mouth daily for 10 days. 02/24/19 03/06/19  ,  C, PA-C  chlorthalidone (HYGROTON) 25 MG tablet Take 1 tablet (25 mg total) by mouth daily. 12/08/18   Ok Edwards, PA-C  clotrimazole (LOTRIMIN) 1 % cream Apply to affected area 2 times daily 02/24/19   ,  C, PA-C  dicyclomine (BENTYL) 20 MG tablet Take 20 mg every 6 (six) hours by mouth.    [provider]  fluconazole (DIFLUCAN) 150 MG tablet Take 1 tablet (150 mg total) by mouth daily. Repeat in 1 week if needed 12/18/18   Robyn Haber, MD  fluticasone [facility]) 50 MCG/ACT nasal spray Place 1 spray daily into both  nostrils. 08/10/17   Jacqualine Mau, NP  hydroxychloroquine (PLAQUENIL) 200 MG tablet Take daily by mouth.    [provider]  labetalol (NORMODYNE) 100 MG tablet Take 100 mg 2 (two) times daily by mouth.    [provider]  losartan (COZAAR) 100 MG tablet Take 100 mg by mouth daily.    [provider]  metroNIDAZOLE (FLAGYL) 500 MG tablet Take 1 tablet (500 mg total) by mouth 2 (two) times daily. 12/18/18   Robyn Haber, MD  omeprazole (PRILOSEC) 40 MG capsule Take 40 mg daily by mouth.    [provider]  predniSONE (DELTASONE) 20 MG tablet Take 1 tablet (20 mg total) by mouth 2 (two) times daily with a meal for 4 days. 02/24/19 02/28/19  , Elesa Hacker, PA-C    Family History Family History  Problem Relation Age of Onset  . Hypertension Mother   . Hypertension Father   . Heart failure Father   . Cancer Father     Social History Social History   Tobacco Use  . Smoking status: Never Smoker  . Smokeless tobacco: Never Used  Substance Use Topics  . Alcohol use: No    Frequency: Never  . Drug use: No     Allergies   Patient has no known allergies.   Review of Systems Review of  Systems  Constitutional: Negative for fatigue and fever.  HENT: Positive for facial swelling. Negative for congestion, ear discharge, ear pain, mouth sores, rhinorrhea and sore throat.   Eyes: Negative for visual disturbance.  Respiratory: Negative for shortness of breath.   Cardiovascular: Negative for chest pain.  Gastrointestinal: Negative for abdominal pain, nausea and vomiting.  Musculoskeletal: Negative for arthralgias and joint swelling.  Skin: Positive for rash. Negative for color change and wound.  Neurological: Negative for dizziness, weakness, light-headedness and headaches.     Physical Exam Triage Vital Signs ED Triage Vitals  Enc Vitals Group     BP 02/24/19 1643 (!) 155/94     Pulse Rate 02/24/19 1643 90     Resp 02/24/19 1643 18      Temp 02/24/19 1643 98.4 F (36.9 C)     Temp Source 02/24/19 1643 Oral     SpO2 02/24/19 1643 96 %     Weight --      Height --      Head Circumference --      Peak Flow --      Pain Score 02/24/19 1640 2     Pain Loc --      Pain Edu? --      Excl. in [place]? --    No data found.  Updated Vital Signs BP (!) 155/94 (BP Location: Left Arm) Comment (BP Location): large cuff  Pulse 90   Temp 98.4 F (36.9 C) (Oral)   Resp 18   SpO2 96%   Visual Acuity Right Eye Distance:   Left Eye Distance:   Bilateral Distance:    Right Eye Near:   Left Eye Near:    Bilateral Near:     Physical Exam Vitals signs and nursing note reviewed.  Constitutional:      General: She is not in acute distress.    Appearance: She is well-developed.  HENT:     Head: Normocephalic and atraumatic.     Mouth/Throat:     Comments: No lesions noted in oral mucosa Lips do appear slightly dry, mild cracking present Perioral area with small papular bumps that appears skin color, no erythema noted  Eyes:     Extraocular Movements: Extraocular movements intact.     Conjunctiva/sclera: Conjunctivae normal.     Pupils: Pupils are equal, round, and reactive to light.     Comments: No periorbital swelling  Neck:     Musculoskeletal: Neck supple.  Cardiovascular:     Rate and Rhythm: Normal rate and regular rhythm.     Heart sounds: No murmur.  Pulmonary:     Effort: Pulmonary effort is normal. No respiratory distress.     Breath sounds: Normal breath sounds.  Abdominal:     Palpations: Abdomen is soft.     Tenderness: There is no abdominal tenderness.  Skin:    General: Skin is warm and dry.     Comments: No rash noted elsewhere on body  Neurological:     Mental Status: She is alert.      UC Treatments / Results  Labs (all labs ordered are listed, but only abnormal results are displayed) Labs Reviewed - No data to display  EKG None  Radiology No results found.  Procedures  Procedures (including critical care time)  Medications Ordered in UC Medications - No data to display  Initial Impression / Assessment and Plan / UC Course  I have reviewed the triage vital signs and the nursing notes.  Pertinent labs &  imaging results that were available during my care of the patient were reviewed by me and considered in my medical decision making (see chart for details).     Faint rash noted around lips, possible contact dermatitis.  Given symptoms we will do trial of prednisone x5 days along with antihistamines.  Continue moisturizing measures.  Will avoid topical steroids at this time to avoid any skin discoloration.  May also try Lotrimin twice daily to area to also treat for any possible underlying fungal infection.  Continue to monitor rash and symptoms,Discussed strict return precautions. Patient verbalized understanding and is agreeable with plan.  Final Clinical Impressions(s) / UC Diagnoses   Final diagnoses:  Rash and nonspecific skin eruption     Discharge Instructions     Begin prednisone 20 mg twice daily for the next 4 days.  Please take with food-this should help calm down swelling and itching Please also use with cetirizine/Zyrtec to further help with itching May try using Lotrimin twice daily to help with any fungal cause  Please follow-up if rash changing, worsening, moving inside the mouth   ED Prescriptions    Medication Sig Dispense Auth. Provider   predniSONE (DELTASONE) 20 MG tablet Take 1 tablet (20 mg total) by mouth 2 (two) times daily with a meal for 4 days. 8 tablet ,  C, PA-C   clotrimazole (LOTRIMIN) 1 % cream Apply to affected area 2 times daily 15 g ,  C, PA-C   Cetirizine HCl 10 MG CAPS Take 1 capsule (10 mg total) by mouth daily for 10 days. 10 capsule ,  C, PA-C     Controlled Substance Prescriptions [place] Controlled Substance Registry consulted? Not Applicable   Janith Lima,  [place] 02/24/19 2015

## 2019-02-24 NOTE — ED Triage Notes (Signed)
Intermittent rash to face and lips for a month.  Rash itches, burning, tingling.  History of the same.

## 2019-02-24 NOTE — Discharge Instructions (Signed)
Begin prednisone 20 mg twice daily for the next 4 days.  Please take with food-this should help calm down swelling and itching Please also use with cetirizine/Zyrtec to further help with itching May try using Lotrimin twice daily to help with any fungal cause  Please follow-up if rash changing, worsening, moving inside the mouth

## 2019-03-04 DIAGNOSIS — M179 Osteoarthritis of knee, unspecified: Secondary | ICD-10-CM | POA: Diagnosis not present

## 2019-03-04 DIAGNOSIS — I10 Essential (primary) hypertension: Secondary | ICD-10-CM | POA: Diagnosis not present

## 2019-03-04 DIAGNOSIS — R7303 Prediabetes: Secondary | ICD-10-CM | POA: Diagnosis not present

## 2019-03-04 DIAGNOSIS — N760 Acute vaginitis: Secondary | ICD-10-CM | POA: Diagnosis not present

## 2019-03-06 DIAGNOSIS — I10 Essential (primary) hypertension: Secondary | ICD-10-CM | POA: Diagnosis not present

## 2019-03-31 DIAGNOSIS — I10 Essential (primary) hypertension: Secondary | ICD-10-CM | POA: Diagnosis not present

## 2019-04-03 DIAGNOSIS — M3210 Systemic lupus erythematosus, organ or system involvement unspecified: Secondary | ICD-10-CM | POA: Diagnosis not present

## 2019-04-03 DIAGNOSIS — G894 Chronic pain syndrome: Secondary | ICD-10-CM | POA: Diagnosis not present

## 2019-04-03 DIAGNOSIS — I10 Essential (primary) hypertension: Secondary | ICD-10-CM | POA: Diagnosis not present

## 2019-04-03 DIAGNOSIS — L259 Unspecified contact dermatitis, unspecified cause: Secondary | ICD-10-CM | POA: Diagnosis not present

## 2019-04-03 DIAGNOSIS — M179 Osteoarthritis of knee, unspecified: Secondary | ICD-10-CM | POA: Diagnosis not present

## 2019-04-15 DIAGNOSIS — G894 Chronic pain syndrome: Secondary | ICD-10-CM | POA: Diagnosis not present

## 2019-04-15 DIAGNOSIS — M179 Osteoarthritis of knee, unspecified: Secondary | ICD-10-CM | POA: Diagnosis not present

## 2019-04-15 DIAGNOSIS — I10 Essential (primary) hypertension: Secondary | ICD-10-CM | POA: Diagnosis not present

## 2019-04-15 DIAGNOSIS — M3210 Systemic lupus erythematosus, organ or system involvement unspecified: Secondary | ICD-10-CM | POA: Diagnosis not present

## 2019-04-30 DIAGNOSIS — I10 Essential (primary) hypertension: Secondary | ICD-10-CM | POA: Diagnosis not present

## 2019-05-01 DIAGNOSIS — J4520 Mild intermittent asthma, uncomplicated: Secondary | ICD-10-CM | POA: Diagnosis not present

## 2019-05-01 DIAGNOSIS — J302 Other seasonal allergic rhinitis: Secondary | ICD-10-CM | POA: Diagnosis not present

## 2019-05-01 DIAGNOSIS — M179 Osteoarthritis of knee, unspecified: Secondary | ICD-10-CM | POA: Diagnosis not present

## 2019-05-01 DIAGNOSIS — I10 Essential (primary) hypertension: Secondary | ICD-10-CM | POA: Diagnosis not present

## 2019-05-01 DIAGNOSIS — R7303 Prediabetes: Secondary | ICD-10-CM | POA: Diagnosis not present

## 2019-05-13 ENCOUNTER — Encounter (HOSPITAL_COMMUNITY): Payer: Self-pay | Admitting: Emergency Medicine

## 2019-05-13 ENCOUNTER — Emergency Department (HOSPITAL_COMMUNITY)
Admission: EM | Admit: 2019-05-13 | Discharge: 2019-05-13 | Disposition: A | Discharge status: Home / Self Care | Payer: Medicare Other | Attending: Emergency Medicine | Admitting: Emergency Medicine

## 2019-05-13 ENCOUNTER — Emergency Department (HOSPITAL_COMMUNITY): Payer: Medicare Other

## 2019-05-13 DIAGNOSIS — R51 Headache: Secondary | ICD-10-CM | POA: Diagnosis not present

## 2019-05-13 DIAGNOSIS — R519 Headache, unspecified: Secondary | ICD-10-CM

## 2019-05-13 DIAGNOSIS — J321 Chronic frontal sinusitis: Secondary | ICD-10-CM

## 2019-05-13 DIAGNOSIS — Z79899 Other long term (current) drug therapy: Secondary | ICD-10-CM | POA: Diagnosis not present

## 2019-05-13 DIAGNOSIS — R110 Nausea: Secondary | ICD-10-CM | POA: Diagnosis not present

## 2019-05-13 DIAGNOSIS — R52 Pain, unspecified: Secondary | ICD-10-CM | POA: Diagnosis not present

## 2019-05-13 DIAGNOSIS — J0110 Acute frontal sinusitis, unspecified: Secondary | ICD-10-CM | POA: Diagnosis not present

## 2019-05-13 DIAGNOSIS — I10 Essential (primary) hypertension: Secondary | ICD-10-CM | POA: Diagnosis not present

## 2019-05-13 LAB — BASIC METABOLIC PANEL
Anion gap: 10 (ref 5–15)
BUN: 5 mg/dL — ABNORMAL LOW (ref 6–20)
CO2: 24 mmol/L (ref 22–32)
Calcium: 9.1 mg/dL (ref 8.9–10.3)
Chloride: 103 mmol/L (ref 98–111)
Creatinine, Ser: 0.63 mg/dL (ref 0.44–1.00)
GFR calc Af Amer: >60 mL/min (ref >60)
GFR calc non Af Amer: >60 mL/min (ref >60)
Glucose, Bld: 134 mg/dL — ABNORMAL HIGH (ref 70–99)
Potassium: 4.1 mmol/L (ref 3.5–5.1)
Sodium: 137 mmol/L (ref 135–145)

## 2019-05-13 LAB — CBC
HCT: 40.4 % (ref 36.0–46.0)
Hemoglobin: 13.4 g/dL (ref 12.0–15.0)
MCH: 30.5 pg (ref 26.0–34.0)
MCHC: 33.2 g/dL (ref 30.0–36.0)
MCV: 91.8 fL (ref 80.0–100.0)
Platelets: 321 10*3/uL (ref 150–400)
RBC: 4.4 MIL/uL (ref 3.87–5.11)
RDW: 12.2 % (ref 11.5–15.5)
WBC: 7.1 10*3/uL (ref 4.0–10.5)
nRBC: 0 % (ref 0.0–0.2)

## 2019-05-13 LAB — TROPONIN I (HIGH SENSITIVITY): Troponin I (High Sensitivity): 3 ng/L (ref <18)

## 2019-05-13 LAB — I-STAT BETA HCG BLOOD, ED (MC, WL, AP ONLY): I-stat hCG, quantitative: <5 m[IU]/mL (ref <5)

## 2019-05-13 MED ORDER — KETOROLAC TROMETHAMINE 15 MG/ML IJ SOLN: 15.0000 mg | Freq: Once | INTRAMUSCULAR | Status: DC

## 2019-05-13 MED ORDER — DIPHENHYDRAMINE HCL 50 MG/ML IJ SOLN
12.5000 mg | Freq: Once | INTRAMUSCULAR | Status: AC
  Administered 2019-05-13: 18:00:00 12.5 mg via INTRAVENOUS
  Filled 2019-05-13: qty 1

## 2019-05-13 MED ORDER — METOCLOPRAMIDE HCL 5 MG/ML IJ SOLN
10.0000 mg | Freq: Once | INTRAMUSCULAR | Status: AC
  Administered 2019-05-13: 10 mg via INTRAVENOUS
  Filled 2019-05-13: qty 2

## 2019-05-13 MED ORDER — FLUTICASONE PROPIONATE 50 MCG/ACT NA SUSP: 1.0000 | Freq: Every day | NASAL | 0 refills | Status: DC

## 2019-05-13 MED ORDER — SODIUM CHLORIDE 0.9 % IV BOLUS
500.0000 mL | Freq: Once | INTRAVENOUS | Status: AC
  Administered 2019-05-13: 500 mL via INTRAVENOUS

## 2019-05-13 NOTE — ED Triage Notes (Signed)
Pt called ems due to elevated BP and headache. With ems pt was 198/112. Pt has not taken her bp medication today due to headache and nausea. Pt denies any weakness or vision changes.

## 2019-05-13 NOTE — ED Notes (Signed)
All appropriate discharge materials reviewed with patient at length. Time for questions provided. Pt denies any further questions at this time. Verbalizes understanding of all provided materials.  

## 2019-05-13 NOTE — ED Notes (Signed)
Pt asking for something to eat. Pt informed that she needs to have a CT scan before she can eat.

## 2019-05-13 NOTE — ED Notes (Signed)
PT taking home meds now.

## 2019-05-13 NOTE — Discharge Instructions (Signed)
Please take your blood pressure medicines tonight Take allergy medicine and start flonase for sinus/allergies Please follow up with your doctor

## 2019-05-13 NOTE — ED Provider Notes (Signed)
[facility] EMERGENCY DEPARTMENT Provider Note   CSN: [id number] Arrival date & time: 05/13/19  1351     History   Chief Complaint Chief Complaint  Patient presents with  . Headache    HPI Debra Cooper is a 43 y.o. female who presents with a headache.  Past medical history significant for lupus, hypertension, GERD.  Patient states over the past month she has had an intermittent frontal headache.  She has been having it more frequently and the headache has been more severe in nature recently.  She is been having problems with her blood pressure control.  Her PCP has been changing her blood pressure medicine.  It was last changed 2 weeks ago and she states that still has not controlled her blood pressure adequately.  This morning she woke up with a headache.  She reports associated sweating, nausea and vomiting.  She also has some photophobia.  She took Tylenol and since it did not relieve her headache she called her PCP.  Due to her symptoms her PCP wanted her to come to the emergency department to be checked.  Patient denies dizziness, loss of consciousness, head trauma, neck pain or back pain, fever, paresthesias, unilateral weakness or vision loss.  She denies any chest pain or trouble breathing.  She did not take her antihypertensives this morning because of nausea.     HPI  Past Medical History:  Diagnosis Date  . Hypertension   . Lupus ([place])     There are no active problems to display for this patient.   History reviewed. No pertinent surgical history.   OB History   No obstetric history on file.      Home Medications    Prior to Admission medications   Medication Sig Start Date End Date Taking? Authorizing Provider  Cetirizine HCl 10 MG CAPS Take 1 capsule (10 mg total) by mouth daily for 10 days. 02/24/19 03/06/19  Wieters, Hallie C, PA-C  chlorthalidone (HYGROTON) 25 MG tablet Take 1 tablet (25 mg total) by mouth daily. 12/08/18   Ok Edwards, PA-C   clotrimazole (LOTRIMIN) 1 % cream Apply to affected area 2 times daily 02/24/19   Wieters, Hallie C, PA-C  dicyclomine (BENTYL) 20 MG tablet Take 20 mg every 6 (six) hours by mouth.    [provider]  fluconazole (DIFLUCAN) 150 MG tablet Take 1 tablet (150 mg total) by mouth daily. Repeat in 1 week if needed 12/18/18   Robyn Haber, MD  fluticasone [facility]) 50 MCG/ACT nasal spray Place 1 spray daily into both nostrils. 08/10/17   Jacqualine Mau, NP  hydroxychloroquine (PLAQUENIL) 200 MG tablet Take daily by mouth.    [provider]  labetalol (NORMODYNE) 100 MG tablet Take 100 mg 2 (two) times daily by mouth.    [provider]  losartan (COZAAR) 100 MG tablet Take 100 mg by mouth daily.    [provider]  metroNIDAZOLE (FLAGYL) 500 MG tablet Take 1 tablet (500 mg total) by mouth 2 (two) times daily. 12/18/18   Robyn Haber, MD  omeprazole (PRILOSEC) 40 MG capsule Take 40 mg daily by mouth.    [provider]    Family History Family History  Problem Relation Age of Onset  . Hypertension Mother   . Hypertension Father   . Heart failure Father   . Cancer Father     Social History Social History   Tobacco Use  . Smoking status: Never Smoker  . Smokeless  tobacco: Never Used  Substance Use Topics  . Alcohol use: No    Frequency: Never  . Drug use: No     Allergies   Patient has no known allergies.   Review of Systems Review of Systems  Constitutional: Positive for diaphoresis. Negative for fever.  Eyes: Negative for visual disturbance.  Respiratory: Negative for cough and shortness of breath.   Cardiovascular: Negative for chest pain, palpitations and leg swelling.  Gastrointestinal: Positive for nausea. Negative for abdominal pain and vomiting.  Musculoskeletal: Negative for back pain and neck pain.  Allergic/Immunologic: Positive for immunocompromised state.  Neurological: Positive for headaches. Negative for  dizziness, syncope, speech difficulty, weakness and numbness.  All other systems reviewed and are negative.    Physical Exam Updated Vital Signs BP (!) 192/103 (BP Location: Right Wrist)   Pulse 77   Temp (!) 97.5 F (36.4 C) (Oral)   Resp 20   SpO2 98%   Physical Exam Vitals signs and nursing note reviewed.  Constitutional:      General: She is not in acute distress.    Appearance: She is well-developed. She is obese. She is not ill-appearing.  HENT:     Head: Normocephalic and atraumatic.  Eyes:     General: No scleral icterus.       Right eye: No discharge.        Left eye: No discharge.     Conjunctiva/sclera: Conjunctivae normal.     Pupils: Pupils are equal, round, and reactive to light.  Neck:     Musculoskeletal: Normal range of motion.  Cardiovascular:     Rate and Rhythm: Normal rate and regular rhythm.  Pulmonary:     Effort: Pulmonary effort is normal. No respiratory distress.     Breath sounds: Normal breath sounds.  Abdominal:     General: There is no distension.     Palpations: Abdomen is soft.     Tenderness: There is no abdominal tenderness.  Skin:    General: Skin is warm and dry.  Neurological:     Mental Status: She is alert and oriented to person, place, and time.     Comments: Mental Status:  Alert, oriented, thought content appropriate, able to give a coherent history. Speech fluent without evidence of aphasia. Able to follow 2 step commands without difficulty.  Cranial Nerves:  II:  Peripheral visual fields grossly normal, pupils equal, round, reactive to light III,IV, VI: ptosis not present, extra-ocular motions intact bilaterally  V,VII: smile symmetric, facial light touch sensation equal VIII: hearing grossly normal to voice  X: uvula elevates symmetrically  XI: bilateral shoulder shrug symmetric and strong XII: midline tongue extension without fassiculations Motor:  Normal tone. 5/5 in upper and lower extremities bilaterally including  strong and equal grip strength and dorsiflexion/plantar flexion Cerebellar: normal finger-to-nose with bilateral upper extremities Gait: normal gait and balance CV: distal pulses palpable throughout    Psychiatric:        Behavior: Behavior normal.      ED Treatments / Results  Labs (all labs ordered are listed, but only abnormal results are displayed) Labs Reviewed  BASIC METABOLIC PANEL - Abnormal; Notable for the following components:      Result Value   Glucose, Bld 134 (*)    BUN 5 (*)    All other components within normal limits  CBC  I-STAT BETA HCG BLOOD, ED ([facility], [facility], [facility] ONLY)  TROPONIN I (HIGH SENSITIVITY)  TROPONIN I (HIGH SENSITIVITY)    EKG  None  Radiology Ct Head Wo Contrast  Result Date: 05/13/2019 CLINICAL DATA:  Severe acute headache, worst of life. Hypertension. No reported injury. EXAM: CT HEAD WITHOUT CONTRAST TECHNIQUE: Contiguous axial images were obtained from the base of the skull through the vertex without intravenous contrast. COMPARISON:  None. FINDINGS: Brain: No evidence of parenchymal hemorrhage or extra-axial fluid collection. No mass lesion, mass effect, or midline shift. No CT evidence of acute infarction. Cerebral volume is age appropriate. No ventriculomegaly. Vascular: No acute abnormality. Skull: No evidence of calvarial fracture. Sinuses/Orbits: Mucoperiosteal thickening in the right sphenoid and bilateral maxillary sinuses with small left maxillary fluid level. Other:  The mastoid air cells are unopacified. IMPRESSION: 1.  No evidence of acute intracranial abnormality. 2. Paranasal sinusitis, of uncertain chronicity, possibly acute. Electronically Signed   By: Ilona Sorrel M.D.   On: 05/13/2019 18:57    Procedures Procedures (including critical care time)  Medications Ordered in ED Medications  sodium chloride 0.9 % bolus 500 mL (0 mLs Intravenous Stopped 05/13/19 1900)  metoCLOPramide (REGLAN) injection 10 mg (10 mg Intravenous Given  05/13/19 1750)  diphenhydrAMINE (BENADRYL) injection 12.5 mg (12.5 mg Intravenous Given 05/13/19 1750)     Initial Impression / Assessment and Plan / ED Course  I have reviewed the triage vital signs and the nursing notes.  Pertinent labs & imaging results that were available during my care of the patient were reviewed by me and considered in my medical decision making (see chart for details).  43 year old female presents with gradually worsening headache over the past month with associated nausea, vomiting, diaphoresis and photophobia.  She also has significantly elevated blood pressure and states that she has not taken her blood pressure medicine today because of the nausea.  Her neurologic exam is unremarkable.  Blood work obtained in triage is normal.  Will obtain CT of the head and give migraine cocktail and have her take her home blood pressure medicines.  CT is remarkable for paranasal sinusitis.  The patient does endorse issues with allergies.  We will add Flonase.  She reports almost complete resolution of her headache after the migraine cocktail and states that she is ready for discharge.  Blood pressure is improved after medications.  Will d/c with PCP follow-up  Final Clinical Impressions(s) / ED Diagnoses   Final diagnoses:  Frontal sinusitis, unspecified chronicity  Acute nonintractable headache, unspecified headache type  Hypertension, unspecified type    ED Discharge Orders    None       Recardo Evangelist, PA-C 05/13/19 Joan Mayans, MD 05/14/19 1240

## 2019-05-15 DIAGNOSIS — I10 Essential (primary) hypertension: Secondary | ICD-10-CM | POA: Diagnosis not present

## 2019-05-15 DIAGNOSIS — J4520 Mild intermittent asthma, uncomplicated: Secondary | ICD-10-CM | POA: Diagnosis not present

## 2019-05-15 DIAGNOSIS — J302 Other seasonal allergic rhinitis: Secondary | ICD-10-CM | POA: Diagnosis not present

## 2019-05-18 DIAGNOSIS — G894 Chronic pain syndrome: Secondary | ICD-10-CM | POA: Diagnosis not present

## 2019-05-18 DIAGNOSIS — M179 Osteoarthritis of knee, unspecified: Secondary | ICD-10-CM | POA: Diagnosis not present

## 2019-05-18 DIAGNOSIS — M5136 Other intervertebral disc degeneration, lumbar region: Secondary | ICD-10-CM | POA: Diagnosis not present

## 2019-05-18 DIAGNOSIS — M329 Systemic lupus erythematosus, unspecified: Secondary | ICD-10-CM | POA: Diagnosis not present

## 2019-05-28 DIAGNOSIS — J069 Acute upper respiratory infection, unspecified: Secondary | ICD-10-CM | POA: Diagnosis not present

## 2019-05-28 DIAGNOSIS — I10 Essential (primary) hypertension: Secondary | ICD-10-CM | POA: Diagnosis not present

## 2019-05-28 DIAGNOSIS — J4520 Mild intermittent asthma, uncomplicated: Secondary | ICD-10-CM | POA: Diagnosis not present

## 2019-05-31 DIAGNOSIS — I10 Essential (primary) hypertension: Secondary | ICD-10-CM | POA: Diagnosis not present

## 2019-06-02 DIAGNOSIS — G894 Chronic pain syndrome: Secondary | ICD-10-CM | POA: Diagnosis not present

## 2019-06-02 DIAGNOSIS — M5136 Other intervertebral disc degeneration, lumbar region: Secondary | ICD-10-CM | POA: Diagnosis not present

## 2019-06-02 DIAGNOSIS — M329 Systemic lupus erythematosus, unspecified: Secondary | ICD-10-CM | POA: Diagnosis not present

## 2019-06-02 DIAGNOSIS — Z79891 Long term (current) use of opiate analgesic: Secondary | ICD-10-CM | POA: Diagnosis not present

## 2019-06-15 DIAGNOSIS — J4520 Mild intermittent asthma, uncomplicated: Secondary | ICD-10-CM | POA: Diagnosis not present

## 2019-06-15 DIAGNOSIS — Z6841 Body Mass Index (BMI) 40.0 and over, adult: Secondary | ICD-10-CM | POA: Diagnosis not present

## 2019-06-15 DIAGNOSIS — Z1239 Encounter for other screening for malignant neoplasm of breast: Secondary | ICD-10-CM | POA: Diagnosis not present

## 2019-06-15 DIAGNOSIS — Z23 Encounter for immunization: Secondary | ICD-10-CM | POA: Diagnosis not present

## 2019-06-15 DIAGNOSIS — R7303 Prediabetes: Secondary | ICD-10-CM | POA: Diagnosis not present

## 2019-06-15 DIAGNOSIS — Q858 Other phakomatoses, not elsewhere classified: Secondary | ICD-10-CM | POA: Diagnosis not present

## 2019-06-15 DIAGNOSIS — N760 Acute vaginitis: Secondary | ICD-10-CM | POA: Diagnosis not present

## 2019-06-15 DIAGNOSIS — I10 Essential (primary) hypertension: Secondary | ICD-10-CM | POA: Diagnosis not present

## 2019-06-16 DIAGNOSIS — F4329 Adjustment disorder with other symptoms: Secondary | ICD-10-CM | POA: Diagnosis not present

## 2019-07-01 DIAGNOSIS — I10 Essential (primary) hypertension: Secondary | ICD-10-CM | POA: Diagnosis not present

## 2019-07-03 DIAGNOSIS — J4520 Mild intermittent asthma, uncomplicated: Secondary | ICD-10-CM | POA: Diagnosis not present

## 2019-07-03 DIAGNOSIS — I10 Essential (primary) hypertension: Secondary | ICD-10-CM | POA: Diagnosis not present

## 2019-07-03 DIAGNOSIS — L200 Besnier's prurigo: Secondary | ICD-10-CM | POA: Diagnosis not present

## 2019-07-07 DIAGNOSIS — M5136 Other intervertebral disc degeneration, lumbar region: Secondary | ICD-10-CM | POA: Diagnosis not present

## 2019-07-07 DIAGNOSIS — Z79891 Long term (current) use of opiate analgesic: Secondary | ICD-10-CM | POA: Diagnosis not present

## 2019-07-07 DIAGNOSIS — G894 Chronic pain syndrome: Secondary | ICD-10-CM | POA: Diagnosis not present

## 2019-07-07 DIAGNOSIS — M329 Systemic lupus erythematosus, unspecified: Secondary | ICD-10-CM | POA: Diagnosis not present

## 2019-07-31 DIAGNOSIS — I10 Essential (primary) hypertension: Secondary | ICD-10-CM | POA: Diagnosis not present

## 2019-08-04 DIAGNOSIS — M545 Low back pain: Secondary | ICD-10-CM | POA: Diagnosis not present

## 2019-08-04 DIAGNOSIS — M5136 Other intervertebral disc degeneration, lumbar region: Secondary | ICD-10-CM | POA: Diagnosis not present

## 2019-08-04 DIAGNOSIS — G894 Chronic pain syndrome: Secondary | ICD-10-CM | POA: Diagnosis not present

## 2019-08-04 DIAGNOSIS — M329 Systemic lupus erythematosus, unspecified: Secondary | ICD-10-CM | POA: Diagnosis not present

## 2019-08-06 ENCOUNTER — Encounter: Payer: Self-pay | Admitting: Internal Medicine

## 2019-08-14 DIAGNOSIS — M3210 Systemic lupus erythematosus, organ or system involvement unspecified: Secondary | ICD-10-CM | POA: Diagnosis not present

## 2019-08-14 DIAGNOSIS — J4520 Mild intermittent asthma, uncomplicated: Secondary | ICD-10-CM | POA: Diagnosis not present

## 2019-08-14 DIAGNOSIS — Z6841 Body Mass Index (BMI) 40.0 and over, adult: Secondary | ICD-10-CM | POA: Diagnosis not present

## 2019-08-14 DIAGNOSIS — G894 Chronic pain syndrome: Secondary | ICD-10-CM | POA: Diagnosis not present

## 2019-08-14 DIAGNOSIS — I10 Essential (primary) hypertension: Secondary | ICD-10-CM | POA: Diagnosis not present

## 2019-08-14 DIAGNOSIS — R7303 Prediabetes: Secondary | ICD-10-CM | POA: Diagnosis not present

## 2019-08-31 DIAGNOSIS — I10 Essential (primary) hypertension: Secondary | ICD-10-CM | POA: Diagnosis not present

## 2019-09-01 DIAGNOSIS — M545 Low back pain: Secondary | ICD-10-CM | POA: Diagnosis not present

## 2019-09-01 DIAGNOSIS — M179 Osteoarthritis of knee, unspecified: Secondary | ICD-10-CM | POA: Diagnosis not present

## 2019-09-01 DIAGNOSIS — G894 Chronic pain syndrome: Secondary | ICD-10-CM | POA: Diagnosis not present

## 2019-09-01 DIAGNOSIS — M329 Systemic lupus erythematosus, unspecified: Secondary | ICD-10-CM | POA: Diagnosis not present

## 2019-09-01 DIAGNOSIS — M5136 Other intervertebral disc degeneration, lumbar region: Secondary | ICD-10-CM | POA: Diagnosis not present

## 2020-03-01 ENCOUNTER — Encounter: Payer: Self-pay | Admitting: Internal Medicine

## 2020-03-23 ENCOUNTER — Encounter: Payer: Self-pay | Admitting: *Deleted

## 2020-03-29 ENCOUNTER — Ambulatory Visit: Payer: Medicare Other | Admitting: Internal Medicine

## 2020-04-29 ENCOUNTER — Encounter: Payer: Self-pay | Admitting: Physician Assistant

## 2020-04-29 ENCOUNTER — Ambulatory Visit (INDEPENDENT_AMBULATORY_CARE_PROVIDER_SITE_OTHER): Payer: Medicare Other | Admitting: Physician Assistant

## 2020-04-29 VITALS — BP 112/62 | HR 84 | Ht 59.0 in | Wt 183.2 lb

## 2020-04-29 DIAGNOSIS — K529 Noninfective gastroenteritis and colitis, unspecified: Secondary | ICD-10-CM

## 2020-04-29 MED ORDER — DICYCLOMINE HCL 10 MG PO CAPS: 10.0000 mg | ORAL_CAPSULE | Freq: Three times a day (TID) | ORAL | 2 refills | Status: DC

## 2020-04-29 NOTE — Patient Instructions (Addendum)
If you are age 46 or older, your body mass index should be between 23-30. Your Body mass index is 37 kg/m. If this is out of the aforementioned range listed, please consider follow up with your Primary Care Provider.  If you are age 69 or younger, your body mass index should be between 19-25. Your Body mass index is 37 kg/m. If this is out of the aformentioned range listed, please consider follow up with your Primary Care Provider.   We have sent the following medications to your pharmacy for you to pick up at your convenience: Dicyclomine 10 mg four times daily 20-30 minutes before each meal and at bedtime.  Follow up in 5 weeks.

## 2020-04-29 NOTE — Progress Notes (Addendum)
Chief Complaint: Chronic diarrhea  HPI:    Debra Cooper is a 44 year old African-American female with a past medical history of fibromyalgia, diabetes and lupus, who was referred to me by [facility] for a complaint of chronic diarrhea.    Today, the patient presents to clinic and explains that she has had diarrhea off and on since 2015 and living in [place], she recently moved to our area within the past 6 months to live with her sister.  Tells me that she had a thorough work-up there with stool studies, colonoscopy and "every other lab test possible", and they never really found reasoning for this.  Describes that typically this will occur anytime she eats anything and she will get a lot of gas and abdominal cramping which will then result in an urgent bowel movement which is typically loose about 4 times a day.  Very rarely she will have a solid stool.  Has tried elimination diets which have not worked in the past.  Does not recall ever being on an antispasmodic.  Tells me her colonoscopy was normal but "I am supposed to have a repeat soon".  Also history of cholecystectomy in 2012.    Denies fever, chills, blood in her stool, weight loss or symptoms that awaken her from sleep.  Past Medical History:  Diagnosis Date  . Arthritis   . Binge eating disorder   . Diabetes mellitus, type II ([place])   . Fibromyalgia   . Hypertension   . Lupus ([place])   . Osteoarthritis     Past Surgical History:  Procedure Laterality Date  . CHOLECYSTECTOMY    . VAGINAL HYSTERECTOMY      Current Outpatient Medications  Medication Sig Dispense Refill  . Cetirizine HCl 10 MG CAPS Take 1 capsule (10 mg total) by mouth daily for 10 days. 10 capsule 0  . chlorthalidone (HYGROTON) 25 MG tablet Take 1 tablet (25 mg total) by mouth daily. 90 tablet 0  . clotrimazole (LOTRIMIN) 1 % cream Apply to affected area 2 times daily 15 g 0  . dicyclomine (BENTYL) 20 MG tablet Take 20 mg every 6 (six)  hours by mouth.    . fluconazole (DIFLUCAN) 150 MG tablet Take 1 tablet (150 mg total) by mouth daily. Repeat in 1 week if needed 2 tablet 0  . fluticasone (FLONASE) 50 MCG/ACT nasal spray Place 1 spray into both nostrils daily. 9.9 mL 0  . hydroxychloroquine (PLAQUENIL) 200 MG tablet Take daily by mouth.    . labetalol (NORMODYNE) 100 MG tablet Take 100 mg 2 (two) times daily by mouth.    . losartan (COZAAR) 100 MG tablet Take 100 mg by mouth daily.    . metroNIDAZOLE (FLAGYL) 500 MG tablet Take 1 tablet (500 mg total) by mouth 2 (two) times daily. 14 tablet 0  . omeprazole (PRILOSEC) 40 MG capsule Take 40 mg daily by mouth.     No current facility-administered medications for this visit.    Allergies as of 04/29/2020  . (No Known Allergies)    Family History  Problem Relation Age of Onset  . Hypertension Mother   . Hypertension Father   . Heart failure Father   . Cancer Father     Social History   Socioeconomic History  . Marital status: Single    Spouse name: Not on file  . Number of children: Not on file  . Years of education: Not on file  . Highest education level: Not on file  Occupational History  . Not on file  Tobacco Use  . Smoking status: Never Smoker  . Smokeless tobacco: Never Used  Substance and Sexual Activity  . Alcohol use: No  . Drug use: No  . Sexual activity: Not on file  Other Topics Concern  . Not on file  Social History Narrative  . Not on file   Social Determinants of Health   Financial Resource Strain:   . Difficulty of Paying Living Expenses:   Food Insecurity:   . Worried About Charity fundraiser in the Last Year:   . Arboriculturist in the Last Year:   Transportation Needs:   . Film/video editor (Medical):   Marland Kitchen Lack of Transportation (Non-Medical):   Physical Activity:   . Days of Exercise per Week:   . Minutes of Exercise per Session:   Stress:   . Feeling of Stress :   Social Connections:   . Frequency of Communication  with Friends and Family:   . Frequency of Social Gatherings with Friends and Family:   . Attends Religious Services:   . Active Member of Clubs or Organizations:   . Attends Archivist Meetings:   Marland Kitchen Marital Status:   Intimate Partner Violence:   . Fear of Current or Ex-Partner:   . Emotionally Abused:   Marland Kitchen Physically Abused:   . Sexually Abused:     Review of Systems:    Constitutional: No weight loss, fever or chills Skin: No rash Cardiovascular: No chest pain Respiratory: No SOB  Gastrointestinal: See HPI and otherwise negative Genitourinary: No dysuria  Neurological: No headache Musculoskeletal: No new muscle or joint pain Hematologic: No bleeding  Psychiatric: No history of depression or anxiety   Physical Exam:  Vital signs: BP (!) 112/62   Pulse 84   Ht 4\' 11"  (1.499 m)   Wt 183 lb 3.2 oz (83.1 kg)   BMI 37.00 kg/m   Constitutional:   Pleasant overweight AA female appears to be in NAD, Well developed, Well nourished, alert and cooperative Head:  Normocephalic and atraumatic. Eyes:   PEERL, EOMI. No icterus. Conjunctiva pink. Ears:  Normal auditory acuity. Neck:  Supple Throat: Oral cavity and pharynx without inflammation, swelling or lesion.  Respiratory: Respirations even and unlabored. Lungs clear to auscultation bilaterally.   No wheezes, crackles, or rhonchi.  Cardiovascular: Normal S1, S2. No MRG. Regular rate and rhythm. No peripheral edema, cyanosis or pallor.  Gastrointestinal:  Soft, nondistended, nontender. No rebound or guarding. Normal bowel sounds. No appreciable masses or hepatomegaly. Rectal:  Not performed.  Msk:  Symmetrical without gross deformities. Without edema, no deformity or joint abnormality.  Neurologic:  Alert and  oriented x4;  grossly normal neurologically.  Skin:   Dry and intact without significant lesions or rashes. Psychiatric: Demonstrates good judgement and reason without abnormal affect or behaviors.  No recent labs or  imaging.  Assessment: 1.  Chronic diarrhea: Reports previous labs and testing including colonoscopy in [place] prior to moving here, never found an etiology, she is s/p choley in 2012  Plan: 1.  Discussed that it sounds like she has IBS-D.  We will try her on an antispasmodic.  Prescribed Dicyclomine 10 mg 4 times daily, 20-30 minutes before meals and at bedtime #120 with 2 refills. 2.  We are requesting patient's records from [place].  Once we review these we will be better able to tell her when her next colonoscopy is due and decipher if there is  any other testing necessary for her chronic diarrhea. 3.  Could also trial Cholestyramine in the future given that she is status post cholecystectomy. 4.  Patient to follow in clinic with me in 4-6 weeks.  At that time hopefully we will have gotten a chance to get her records and review them.  She was assigned to Dr. Hilarie Fredrickson this morning.  Ellouise Newer, PA-C [facility] 04/29/2020, 11:34 AM  Cc: Pa, [facility]   Addendum 06/20/2020 9:36 AM  Received records from [facility]. 01/01/2018 patient received a letter regarding an MRI which showed stable small cyst in her pancreas.  Was recommended she have an interval follow-up in 18 months.  (This would have been 07/2019).  Also showed slight dilation of the bile duct which is chronic.  At that time liver function tests were normal.  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  There was a previously described 0.4 cm pancreatic tail lesion which was not well seen.  Follow-up MRI recommended in a year.  Postoperative changes of left hepatic adenoma resection with no evidence of local recurrence.  12/26/2017 CMP normal, CBC normal  12/29/2016 MRI of the abdomen with and without contrast showed no definite evidence of recurrent hepatic adenoma, no change to the 4 mm cystic  lesion in the pancreatic tail.  Questionable new 5 mm cystic lesion in the uncinate process.  Follow-up MRI recommended in a year.  10/26/2015 EGD done for dyspepsia and dysphagia with a moderate sized hiatal hernia, questionable small patch of Barrett's mucosa nonobstructing distal esophageal ring.  Colonoscopy on the same day done for diarrhea, rectal bleeding and history of polyp with a 4 mm polyp at 40 cm mild compromise of the prep and internal hemorrhoids.  Pathology report showed duodenal mucosa with preserved villi and mild increase in intraepithelial lymphocytes, gastric cardia type mucosa with mild to moderate acute and chronic inflammation in the distal esophagus and squamous mucosa with no specific change in the mid esophagus.  Biopsies showed a hyperplastic polyp.  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  We will also need to contact the patient in regards to follow-up MRI/MRCP for known cystic lesion of the pancreas.  Ellouise Newer, PA-C

## 2020-05-11 ENCOUNTER — Telehealth: Payer: Self-pay | Admitting: Physician Assistant

## 2020-05-11 NOTE — Telephone Encounter (Signed)
Pt is wanting to inform the nurse that the medication Bentyl is not helping her diarrhea, she is wanting to know if something else could be prescribed.

## 2020-05-11 NOTE — Telephone Encounter (Signed)
Please advise 

## 2020-05-11 NOTE — Telephone Encounter (Signed)
Let us do a trial of cholestyramine 1 packet twice daily.  Thanks-JLL

## 2020-05-12 MED ORDER — CHOLESTYRAMINE 4 G PO PACK: 4.0000 g | PACK | Freq: Two times a day (BID) | ORAL | 12 refills | Status: DC

## 2020-05-12 NOTE — Telephone Encounter (Signed)
Left message informing patient new script was sent to the pharmacy and to call back in a couple weeks with an update. Script for Cholestyramine sent to pharmacy.

## 2020-05-16 MED ORDER — CHOLESTYRAMINE 4 G PO PACK: 4.0000 g | PACK | Freq: Two times a day (BID) | ORAL | 12 refills | Status: DC

## 2020-05-16 NOTE — Telephone Encounter (Signed)
Sent medication to the correct pharmacy.

## 2020-05-16 NOTE — Addendum Note (Signed)
Addended by: Horris Latino on: 05/16/2020 08:10 AM   Modules accepted: Orders

## 2020-05-23 NOTE — Progress Notes (Signed)
Addendum: Reviewed and agree with assessment and management plan. Nyana Haren M, MD  

## 2020-06-02 ENCOUNTER — Ambulatory Visit: Payer: Self-pay | Admitting: Physician Assistant

## 2020-06-20 ENCOUNTER — Telehealth: Payer: Self-pay

## 2020-06-20 NOTE — Telephone Encounter (Signed)
Left message on machine to call back  

## 2020-06-20 NOTE — Telephone Encounter (Signed)
-----   Message from Levin Erp, [place] sent at 06/20/2020  9:46 AM EDT ----- Regarding: Please call Can you call the patient and see how she is doing.  Also tell her we received records from [place].  She needs to be placed in recall for a colonoscopy in January 2027.  Also she may need a repeat MRI/MRCP for following of a known pancreatic cystic lesion.  Please ask if she has had this done elsewhere needs [place] to follow-up on this.  Thanks, Ellouise Newer, PA-C

## 2020-06-20 NOTE — Telephone Encounter (Signed)
Recall colon entered for 10/2025 with Dr Hilarie Fredrickson.

## 2020-06-21 ENCOUNTER — Telehealth: Payer: Self-pay | Admitting: Nurse Practitioner

## 2020-06-21 ENCOUNTER — Telehealth: Payer: Self-pay | Admitting: Gastroenterology

## 2020-06-21 NOTE — Telephone Encounter (Signed)
The pt states she is doing well.  She did have an MRI/MRCP in [place].  She will call and have that faxed to our office for review.  Recall entered.

## 2020-06-21 NOTE — Telephone Encounter (Signed)
The pt provided a phone number [phone] to call and get MRI results.  She states she called and they requested that we call and request what we need. I have tried several times and the call will not go thru.  Pt aware and she will try and get records herself.

## 2020-06-24 NOTE — Telephone Encounter (Signed)
Error see alternate note

## 2020-11-28 ENCOUNTER — Telehealth: Payer: Self-pay | Admitting: Physician Assistant

## 2020-11-28 NOTE — Telephone Encounter (Signed)
Patient will try adding the dicyclomine up to 4 times a day.  If this does not help she is asked to call back for an office visit.

## 2020-11-28 NOTE — Telephone Encounter (Signed)
Inbound call from patient stating cholestyramine medication is no longer helping for her diarrhea and is requesting something else be sent to [place] [facility] on [address] in [place] please.

## 2020-11-28 NOTE — Telephone Encounter (Signed)
[place] [facility] thanks

## 2020-11-28 NOTE — Telephone Encounter (Signed)
See note below regarding cholestyramine and advise.

## 2021-01-18 ENCOUNTER — Other Ambulatory Visit: Payer: Self-pay | Admitting: Physician Assistant

## 2021-02-13 IMAGING — CT CT HEAD WITHOUT CONTRAST
4 series · 16 of 47 positions shown, 18 images · non-contrast
Comparison: None.

CLINICAL DATA: Severe acute headache, worst of life. Hypertension.
No reported injury.

EXAM:
CT HEAD WITHOUT CONTRAST
TECHNIQUE: Contiguous axial images were obtained from the base of the skull
through the vertex without intravenous contrast.

[Series 2: head wo · axial · 0.46mm/px · z∈[+455,+580]mm · 7 of 35 slices shown, 9 images]
[im 5/35  brain]
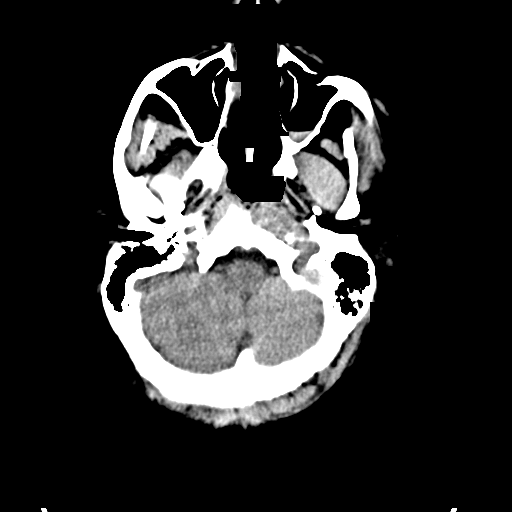
[im 5/35  bone]
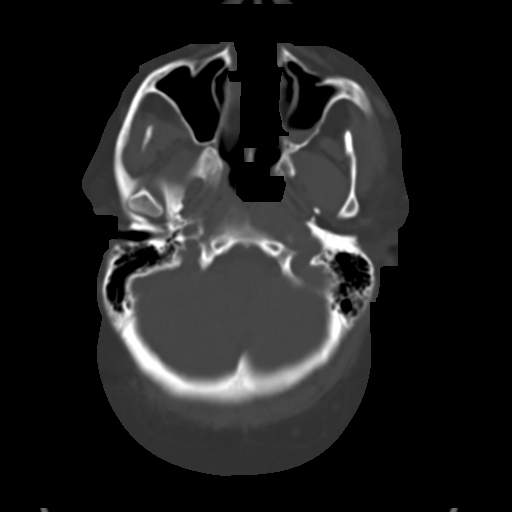
[im 9/35  brain]
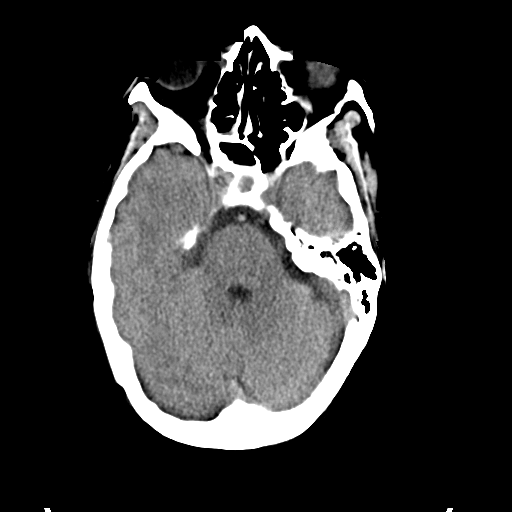
[im 13/35  brain]
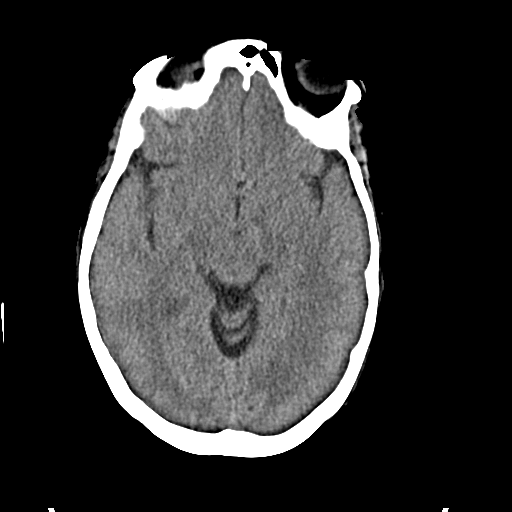
[im 18/35  brain]
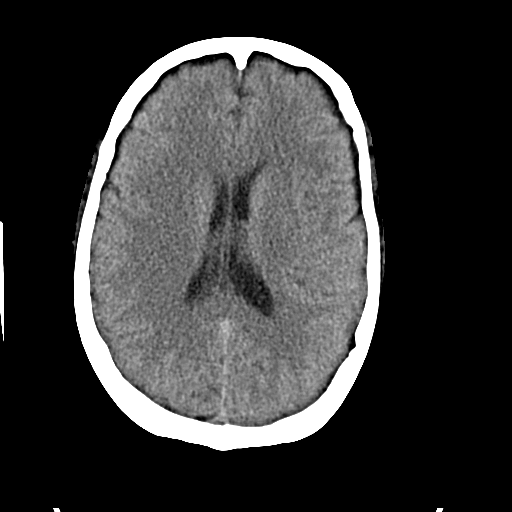
[im 22/35  brain]
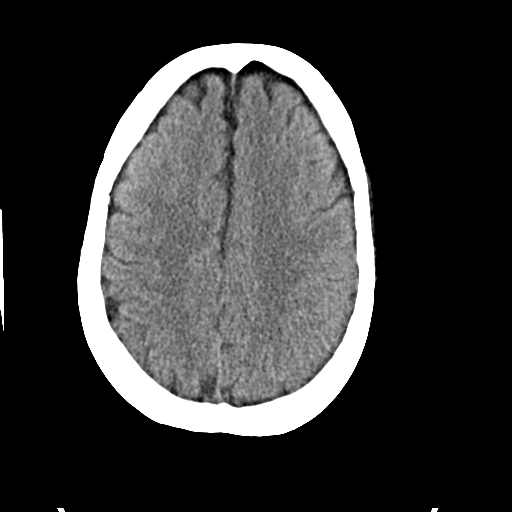
[im 22/35  bone]
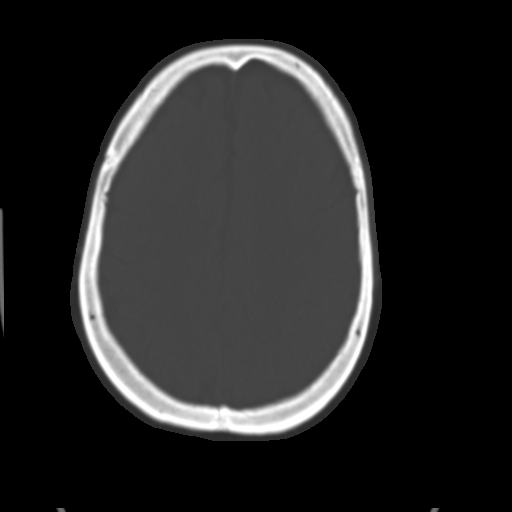
[im 26/35  brain]
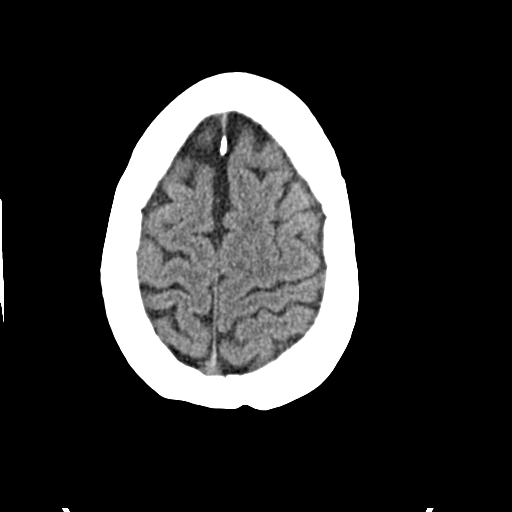
[im 30/35  brain]
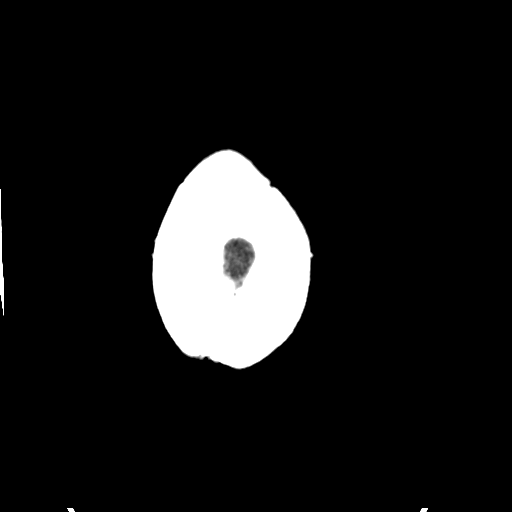

[Series 3: head bone · axial · 0.46mm/px · z∈[+451,+485]mm · 3 of 86 slices shown]
[im 9/86  bone]
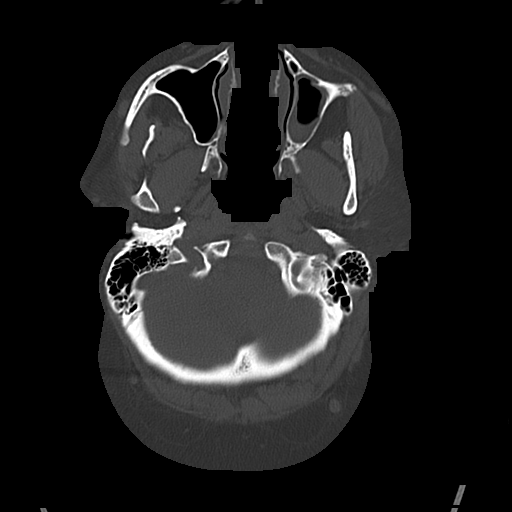
[im 18/86  bone]
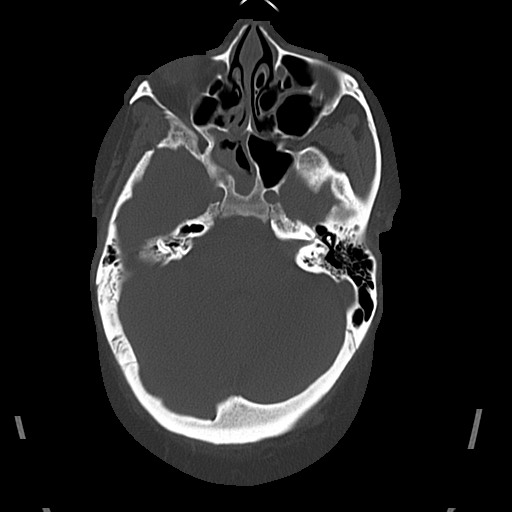
[im 26/86  bone]
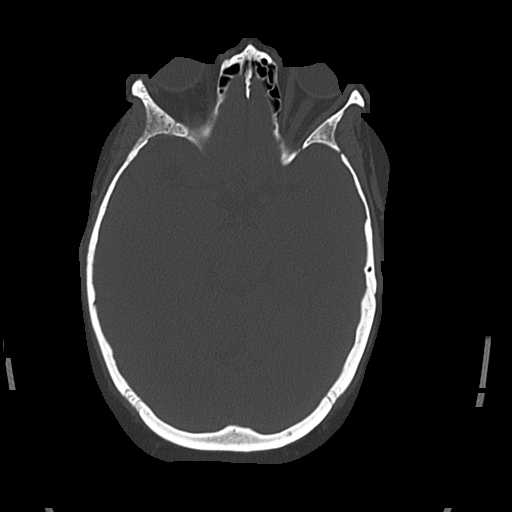

[Series 4: cor soft · coronal · 0.33mm/px · 3 of 73 slices shown]
[im 25/73  brain]
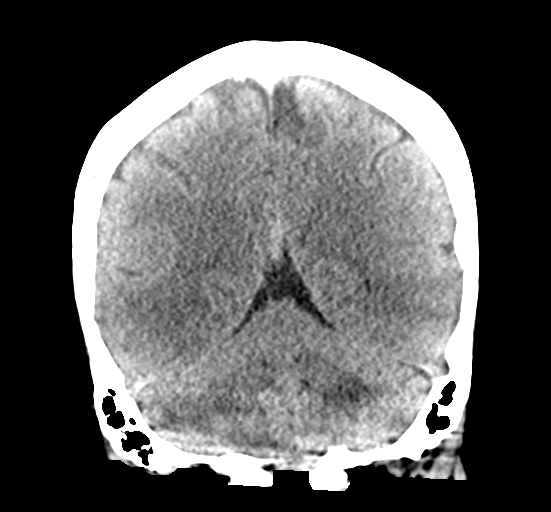
[im 33/73  brain]
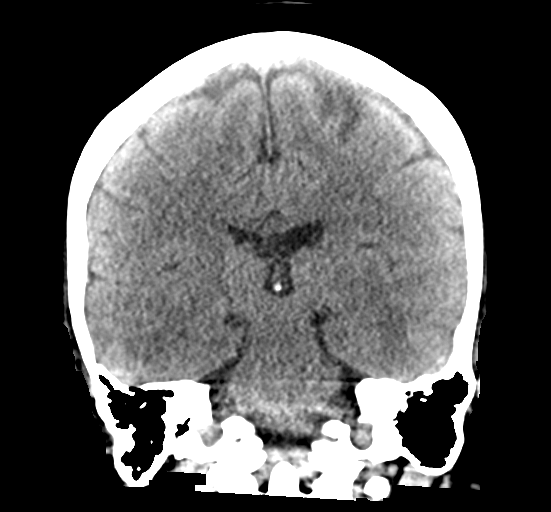
[im 41/73  brain]
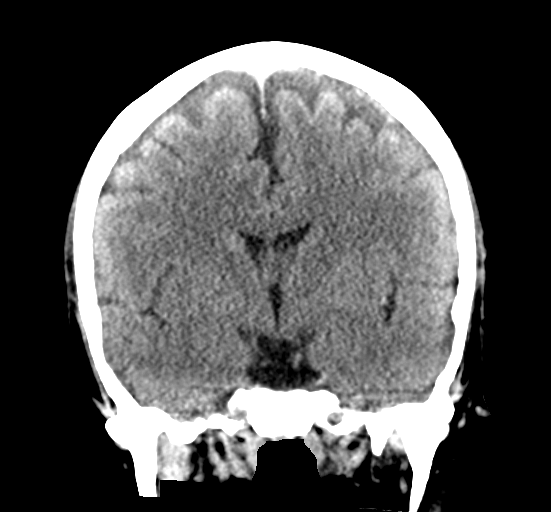

[Series 5: sag soft · sagittal · 0.33mm/px · 3 of 67 slices shown]
[im 23/67  brain]
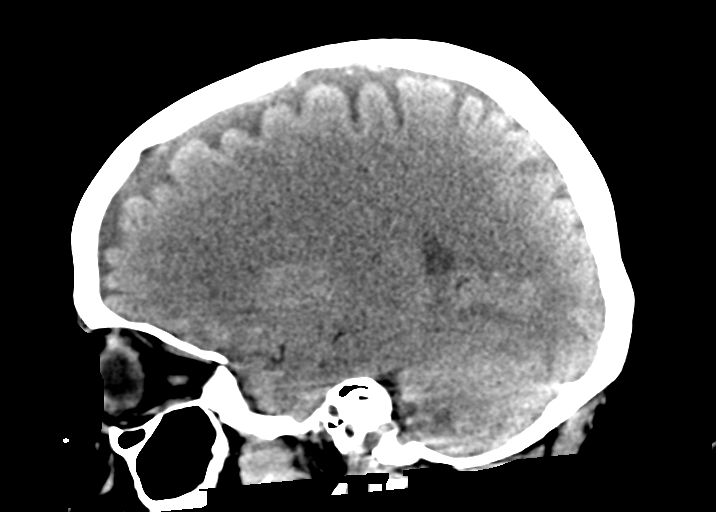
[im 34/67  brain]
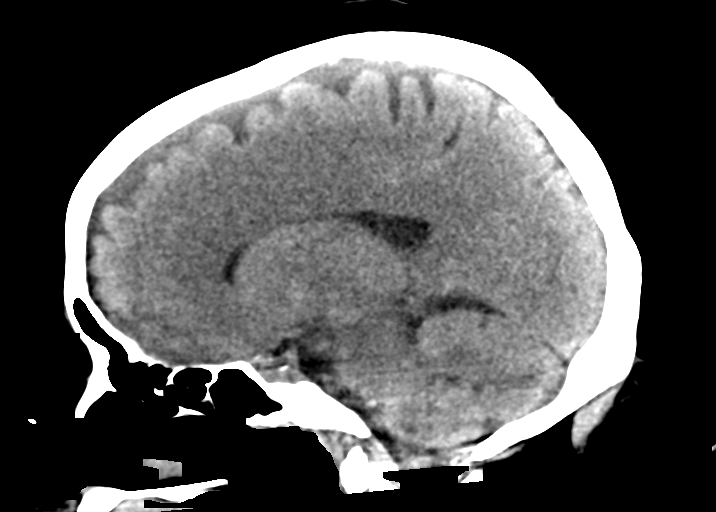
[im 45/67  brain]
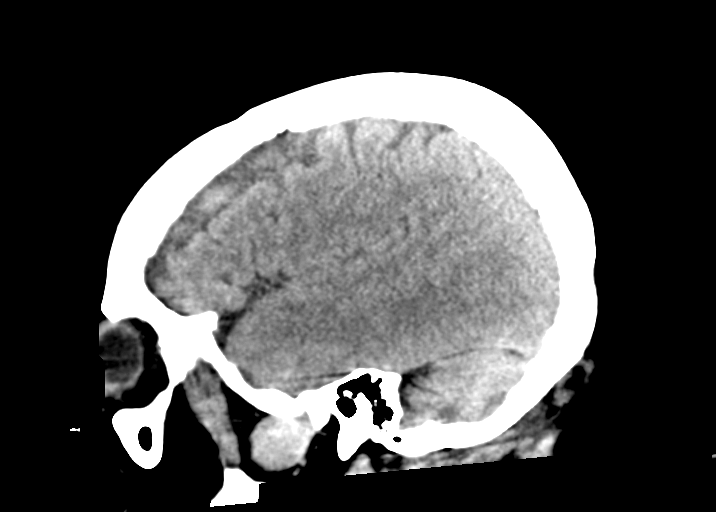

[16 of 47 positions shown; findings below may reference images not displayed]

FINDINGS: Brain: No evidence of parenchymal hemorrhage or extra-axial fluid
collection. No mass lesion, mass effect, or midline shift. No CT
evidence of acute infarction. Cerebral volume is age appropriate. No
ventriculomegaly.

Vascular: No acute abnormality.

Skull: No evidence of calvarial fracture.

Sinuses/Orbits: Mucoperiosteal thickening in the right sphenoid and
bilateral maxillary sinuses with small left maxillary fluid level.

Other:  The mastoid air cells are unopacified.
IMPRESSION: 1.  No evidence of acute intracranial abnormality.
2. Paranasal sinusitis, of uncertain chronicity, possibly acute.

## 2021-06-04 ENCOUNTER — Other Ambulatory Visit: Payer: Self-pay | Admitting: Physician Assistant

## 2021-07-03 ENCOUNTER — Other Ambulatory Visit: Payer: Self-pay | Admitting: Physician Assistant

## 2021-07-06 ENCOUNTER — Other Ambulatory Visit: Payer: Self-pay | Admitting: Physician Assistant

## 2021-07-07 ENCOUNTER — Telehealth: Payer: Self-pay | Admitting: Physician Assistant

## 2021-07-07 MED ORDER — CHOLESTYRAMINE 4 G PO PACK: 4.0000 g | PACK | Freq: Two times a day (BID) | ORAL | 0 refills | Status: DC

## 2021-07-07 NOTE — Telephone Encounter (Signed)
Pt. Called stating she was visiting in [place] and needs a refill of her cholestyramine called into the [facility] in [place], [place] on [address].  Phone number [phone].  Thank you.

## 2021-07-07 NOTE — Telephone Encounter (Signed)
Script into the pharmacy.

## 2021-08-02 ENCOUNTER — Other Ambulatory Visit: Payer: Self-pay | Admitting: Physician Assistant

## 2021-08-10 ENCOUNTER — Telehealth: Payer: Self-pay | Admitting: Physician Assistant

## 2021-08-10 NOTE — Telephone Encounter (Signed)
Inbound call from patient stating she needs additional refills for dicyclomine medication please.

## 2021-08-11 MED ORDER — DICYCLOMINE HCL 10 MG PO CAPS: 10.0000 mg | ORAL_CAPSULE | Freq: Three times a day (TID) | ORAL | 0 refills | Status: DC

## 2021-08-11 NOTE — Telephone Encounter (Signed)
Sent script into pharmacy.

## 2021-08-30 ENCOUNTER — Other Ambulatory Visit: Payer: Self-pay | Admitting: Internal Medicine

## 2021-08-30 DIAGNOSIS — Z1231 Encounter for screening mammogram for malignant neoplasm of breast: Secondary | ICD-10-CM

## 2021-09-07 ENCOUNTER — Other Ambulatory Visit: Payer: Self-pay | Admitting: Physician Assistant

## 2021-09-12 ENCOUNTER — Telehealth: Payer: Self-pay | Admitting: Physician Assistant

## 2021-09-12 NOTE — Telephone Encounter (Signed)
Patient called requesting a refill on Questran also asked if she can get multiple refill so she would not have to call all the time. She said she takes the bus to pick her medications up and sometimes its challenging for her.

## 2021-09-13 MED ORDER — CHOLESTYRAMINE 4 G PO PACK
PACK | ORAL | 0 refills | Status: DC
Start: 2021-09-13 — End: 2021-10-11

## 2021-09-13 NOTE — Telephone Encounter (Signed)
Script sent in for Questran. Patient informed needs office visit. Office visit scheduled for 10/11/21.

## 2021-10-11 ENCOUNTER — Encounter: Payer: Self-pay | Admitting: Physician Assistant

## 2021-10-11 ENCOUNTER — Ambulatory Visit (INDEPENDENT_AMBULATORY_CARE_PROVIDER_SITE_OTHER): Payer: Medicare Other | Admitting: Physician Assistant

## 2021-10-11 VITALS — BP 138/88 | HR 85 | Ht 59.0 in | Wt 298.0 lb

## 2021-10-11 DIAGNOSIS — K529 Noninfective gastroenteritis and colitis, unspecified: Secondary | ICD-10-CM | POA: Diagnosis not present

## 2021-10-11 MED ORDER — CHOLESTYRAMINE 4 G PO PACK: PACK | ORAL | 0 refills | Status: DC

## 2021-10-11 MED ORDER — DICYCLOMINE HCL 10 MG PO CAPS: ORAL_CAPSULE | ORAL | 0 refills | Status: AC

## 2021-10-11 NOTE — Progress Notes (Signed)
Chief Complaint: Follow-up chronic diarrhea  HPI:    Debra Cooper is a 46 year old African-American female, assigned to Dr. Hilarie Fredrickson, with a past medical history as listed below including fibromyalgia, lupus and diabetes, who returns to clinic today for follow-up of her chronic diarrhea and medication refills.    10/26/2015 EGD questional small patch of Barrett's esophagus, the biopsies did not show this.  Colonoscopy on the same day was normal, other than a hyperplastic polyp.    04/29/2020 patient seen in clinic in that time described diarrhea off and on since 2015.  She had had a previous colonoscopy which was unrevealing as above.  At that time started her on Dicyclomine 10 mg 4 times daily.  Also discussed Cholestyramine in the future.  This was eventually added.    Today, the patient presents to clinic and explains that she is taking her Questran twice daily, once in the morning and once in the evening.  She will also use Dicyclomine 10 mg twice daily.  She tells me she has her "good days and bad days", she has more good days than bad days.  Does describe that on bad days she will have diarrhea which is at least 5-6 times a day and pretty loose stool.  She has never tried increasing her Dicyclomine or using Imodium.  In general she feels okay and is fairly happy with the way the medicine is working.    Denies fever, chills, weight loss or blood in her stool.  Past Medical History:  Diagnosis Date   Arthritis    Binge eating disorder    Diabetes mellitus, type II ([place])    Fibromyalgia    Hypertension    Lupus ([place])    Osteoarthritis     Past Surgical History:  Procedure Laterality Date   CHOLECYSTECTOMY     VAGINAL HYSTERECTOMY      Current Outpatient Medications  Medication Sig Dispense Refill   Cetirizine HCl 10 MG CAPS Take 1 capsule (10 mg total) by mouth daily for 10 days. 10 capsule 0   cholestyramine (QUESTRAN) 4 g packet DISSOLVE & TAKE 1 PACKET BY MOUTH TWICE DAILY NEEDS   OFFICE  VISIT  FOR  FURTHER  REFILLS 60 each 0   diclofenac Sodium (VOLTAREN) 1 % GEL Apply 2 g topically 4 (four) times daily.     dicyclomine (BENTYL) 10 MG capsule Take 1 capsule (10 mg total) by mouth 4 (four) times daily -  before meals and at bedtime. 120 capsule 0   DULoxetine (CYMBALTA) 30 MG capsule Take 30 mg by mouth daily.     gabapentin (NEURONTIN) 100 MG capsule Take 100 mg by mouth 3 (three) times daily.     HYDROcodone-acetaminophen (NORCO/VICODIN) 5-325 MG tablet Take 1 tablet by mouth every 6 (six) hours as needed for moderate pain.     hydroxychloroquine (PLAQUENIL) 200 MG tablet Take daily by mouth.     labetalol (NORMODYNE) 100 MG tablet Take 100 mg 2 (two) times daily by mouth.     losartan (COZAAR) 100 MG tablet Take 100 mg by mouth daily.     Multiple Vitamin (MULTIVITAMIN) tablet Take 1 tablet by mouth daily.     omeprazole (PRILOSEC) 40 MG capsule Take 40 mg daily by mouth.     potassium chloride (KLOR-CON) 10 MEQ tablet Take 10 mEq by mouth daily.     triamterene-hydrochlorothiazide (MAXZIDE-25) 37.5-25 MG tablet Take 1 tablet by mouth daily.     No current facility-administered medications for this  visit.    Allergies as of 10/11/2021   (No Known Allergies)    Family History  Problem Relation Age of Onset   Hypertension Mother    Hypertension Father    Heart failure Father    Cancer Father    Colon polyps Neg Hx    Colon cancer Neg Hx     Social History   Socioeconomic History   Marital status: Single    Spouse name: Not on file   Number of children: Not on file   Years of education: Not on file   Highest education level: Not on file  Occupational History   Not on file  Tobacco Use   Smoking status: Never   Smokeless tobacco: Never  Vaping Use   Vaping Use: Never used  Substance and Sexual Activity   Alcohol use: No   Drug use: No   Sexual activity: Not on file  Other Topics Concern   Not on file  Social History Narrative   Not on file    Social Determinants of Health   Financial Resource Strain: Not on file  Food Insecurity: Not on file  Transportation Needs: Not on file  Physical Activity: Not on file  Stress: Not on file  Social Connections: Not on file  Intimate Partner Violence: Not on file    Review of Systems:    Constitutional: No weight loss, fever or chills Cardiovascular: No chest pain Respiratory: No SOB  Gastrointestinal: See HPI and otherwise negative   Physical Exam:  Vital signs: BP 138/88    Pulse 85    Ht 4\' 11"  (1.499 m)    Wt 298 lb (135.2 kg)    BMI 60.19 kg/m    Constitutional:   Pleasant overweight AA female appears to be in NAD, Well developed, Well nourished, alert and cooperative Respiratory: Respirations even and unlabored. Lungs clear to auscultation bilaterally.   No wheezes, crackles, or rhonchi.  Cardiovascular: Normal S1, S2. No MRG. Regular rate and rhythm. No peripheral edema, cyanosis or pallor.  Gastrointestinal:  Soft, nondistended, nontender. No rebound or guarding. Normal bowel sounds. No appreciable masses or hepatomegaly. Psychiatric: Oriented to person, place and time. Demonstrates good judgement and reason without abnormal affect or behaviors.  No recent labs or imaging.  Assessment: 1.  Chronic diarrhea: Thought related to bile salt status postcholecystectomy and IBS, moderately controlled with Dicyclomine and Questran with occasional breakthrough  Plan: 1.  Refilled patient's Questran twice daily, #180 with 3 refills. 2.  Refilled patient's Dicyclomine 10 mg.  Discussed that she can increase this to every 6 hours if she is having a bad day, in general she uses it twice daily.  Was refilled with #240 refills x3 3.  Discussed patient can also use Imodium she is having a very bad day. 4.  Discussed patient's recall colonoscopy is due in 2027. 5.  If patient is doing well she can have another year of refills without being seen in clinic.  She will need to be seen in  2025 for further refills. Follow-up as needed in the interim.  Ellouise Newer, PA-C [facility] 10/11/2021, 3:37 PM  Cc: Pa, [facility]

## 2021-10-11 NOTE — Patient Instructions (Signed)
We have sent your prescription to your pharmacy  If you are age 48 or older, your body mass index should be between 23-30. Your Body mass index is 60.19 kg/m. If this is out of the aforementioned range listed, please consider follow up with your Primary Care Provider.  If you are age 78 or younger, your body mass index should be between 19-25. Your Body mass index is 60.19 kg/m. If this is out of the aformentioned range listed, please consider follow up with your Primary Care Provider.   ________________________________________________________  The [facility] providers would like to encourage you to use [facility] to communicate with providers for non-urgent requests or questions.  Due to long hold times on the telephone, sending your provider a message by [facility] may be a faster and more efficient way to get a response.  Please allow 48 business hours for a response.  Please remember that this is for non-urgent requests.  _______________________________________________________  . Thank you for choosing [facility]  Ellouise Newer PA-C

## 2021-10-12 ENCOUNTER — Encounter: Payer: Self-pay | Admitting: Physician Assistant

## 2021-10-23 NOTE — Progress Notes (Signed)
Addendum: Reviewed and agree with assessment and management plan. Kermitt Harjo M, MD  

## 2021-11-16 ENCOUNTER — Other Ambulatory Visit: Payer: Self-pay | Admitting: Physician Assistant

## 2022-03-21 DIAGNOSIS — M329 Systemic lupus erythematosus, unspecified: Secondary | ICD-10-CM | POA: Diagnosis not present

## 2022-03-21 DIAGNOSIS — M62830 Muscle spasm of back: Secondary | ICD-10-CM | POA: Diagnosis not present

## 2022-03-21 DIAGNOSIS — G629 Polyneuropathy, unspecified: Secondary | ICD-10-CM | POA: Diagnosis not present

## 2022-03-21 DIAGNOSIS — Z79899 Other long term (current) drug therapy: Secondary | ICD-10-CM | POA: Diagnosis not present

## 2022-03-21 DIAGNOSIS — Z6841 Body Mass Index (BMI) 40.0 and over, adult: Secondary | ICD-10-CM | POA: Diagnosis not present

## 2022-03-23 DIAGNOSIS — Z79899 Other long term (current) drug therapy: Secondary | ICD-10-CM | POA: Diagnosis not present

## 2022-03-23 DIAGNOSIS — B3731 Acute candidiasis of vulva and vagina: Secondary | ICD-10-CM | POA: Diagnosis not present

## 2022-03-30 DIAGNOSIS — I10 Essential (primary) hypertension: Secondary | ICD-10-CM | POA: Diagnosis not present

## 2022-04-16 ENCOUNTER — Ambulatory Visit (HOSPITAL_COMMUNITY)
Admission: EM | Admit: 2022-04-16 | Discharge: 2022-04-16 | Disposition: A | Discharge status: Home / Self Care | Payer: Medicare HMO | Attending: Physician Assistant | Admitting: Physician Assistant

## 2022-04-16 ENCOUNTER — Encounter (HOSPITAL_COMMUNITY): Payer: Self-pay | Admitting: Emergency Medicine

## 2022-04-16 ENCOUNTER — Ambulatory Visit (INDEPENDENT_AMBULATORY_CARE_PROVIDER_SITE_OTHER): Payer: Medicare HMO

## 2022-04-16 DIAGNOSIS — I10 Essential (primary) hypertension: Secondary | ICD-10-CM | POA: Diagnosis not present

## 2022-04-16 DIAGNOSIS — M79641 Pain in right hand: Secondary | ICD-10-CM

## 2022-04-16 DIAGNOSIS — H00012 Hordeolum externum right lower eyelid: Secondary | ICD-10-CM

## 2022-04-16 DIAGNOSIS — M79644 Pain in right finger(s): Secondary | ICD-10-CM

## 2022-04-16 NOTE — ED Provider Notes (Signed)
[facility]    CSN: [id number] Arrival date & time: 04/16/22  1508      History   Chief Complaint Chief Complaint  Patient presents with   Hand Pain   Eye Problem    HPI Antrice Pal is a 46 y.o. female.   Pt complains of right hand pain that started about one month ago.  Reports she was cleaning a dog cage when she bent her right ring and little finger back.  Reports at the time she has experienced swelling.  Swelling has improved somewhat.  Reports pain is worse with gripping, no bruising .  She has been taking nothing for the pain.  Pt also complains of a bump to her right lower eyelid that she noticed this morning upon waking.  Bump is painful.  She has tried nothing for the sx.  She does not wear contact or glasses. Denies visual changes, eye redness, or drainage.    Elevated BP here in clinic today.  Reports she did take her medication today.  She has not seen her PCP in over year.  Reports she has an appointment with her PCP next week.  Denies chest pain, shortness of breath, palpitations, headaches, blurred vision.  She has chronic lower extremity swelling which is stable today.     Past Medical History:  Diagnosis Date   Arthritis    Binge eating disorder    Diabetes mellitus, type II ([place])    Fibromyalgia    Hypertension    Lupus ([place])    Osteoarthritis     There are no problems to display for this patient.   Past Surgical History:  Procedure Laterality Date   CHOLECYSTECTOMY     VAGINAL HYSTERECTOMY      OB History   No obstetric history on file.      Home Medications    Prior to Admission medications   Medication Sig Start Date End Date Taking? Authorizing Provider  Cetirizine HCl 10 MG CAPS Take 1 capsule (10 mg total) by mouth daily for 10 days. 02/24/19 04/29/21  Wieters, Hallie C, PA-C  cholestyramine (QUESTRAN) 4 g packet TAKE 1G PACKET BY MOUTH 4 TIMES DAILY AS NEEDED 11/16/21   Levin Erp, PA  diclofenac Sodium  (VOLTAREN) 1 % GEL Apply 2 g topically 4 (four) times daily.    [provider]  dicyclomine (BENTYL) 10 MG capsule Take 1 every 4-6 hours as needed 10/11/21   Levin Erp, PA  DULoxetine (CYMBALTA) 30 MG capsule Take 30 mg by mouth daily.    [provider]  gabapentin (NEURONTIN) 100 MG capsule Take 100 mg by mouth 3 (three) times daily.    [provider]  HYDROcodone-acetaminophen (NORCO/VICODIN) 5-325 MG tablet Take 1 tablet by mouth every 6 (six) hours as needed for moderate pain.    [provider]  hydroxychloroquine (PLAQUENIL) 200 MG tablet Take daily by mouth.    [provider]  labetalol (NORMODYNE) 100 MG tablet Take 100 mg 2 (two) times daily by mouth.    [provider]  losartan (COZAAR) 100 MG tablet Take 100 mg by mouth daily.    [provider]  Multiple Vitamin (MULTIVITAMIN) tablet Take 1 tablet by mouth daily.    [provider]  omeprazole (PRILOSEC) 40 MG capsule Take 40 mg daily by mouth.    [provider]  potassium chloride (KLOR-CON) 10 MEQ tablet Take 10 mEq by mouth daily.    [provider]  triamterene-hydrochlorothiazide (MAXZIDE-25) 37.5-25 MG tablet Take 1 tablet by mouth daily.    [provider]    Family History Family History  Problem Relation Age of Onset   Hypertension Mother    Hypertension Father    Heart failure Father    Cancer Father    Colon polyps Neg Hx    Colon cancer Neg Hx     Social History Social History   Tobacco Use   Smoking status: Never   Smokeless tobacco: Never  Vaping Use   Vaping Use: Never used  Substance Use Topics   Alcohol use: No   Drug use: No     Allergies   Patient has no known allergies.   Review of Systems Review of Systems  Constitutional:  Negative for chills and fever.  HENT:  Negative for ear pain and sore throat.   Eyes:  Positive for pain (lower lid pain). Negative for visual  disturbance.  Respiratory:  Negative for cough and shortness of breath.   Cardiovascular:  Negative for chest pain and palpitations.  Gastrointestinal:  Negative for abdominal pain and vomiting.  Genitourinary:  Negative for dysuria and hematuria.  Musculoskeletal:  Positive for arthralgias (right ring finger pain). Negative for back pain.  Skin:  Negative for color change and rash.  Neurological:  Negative for seizures and syncope.  All other systems reviewed and are negative.    Physical Exam Triage Vital Signs ED Triage Vitals [04/16/22 1650]  Enc Vitals Group     BP      Pulse      Resp      Temp      Temp src      SpO2      Weight      Height      Head Circumference      Peak Flow      Pain Score 8     Pain Loc      Pain Edu?      Excl. in [place]?    No data found.  Updated Vital Signs BP (!) 189/121 (BP Location: Left Wrist) Comment: reports hx of HTN. states she took meds today,  Pulse 84   Temp 98.8 F (37.1 C) (Oral)   Resp 18   SpO2 97%   Visual Acuity Right Eye Distance:   Left Eye Distance:   Bilateral Distance:    Right Eye Near:   Left Eye Near:    Bilateral Near:     Physical Exam   UC Treatments / Results  Labs (all labs ordered are listed, but only abnormal results are displayed) Labs Reviewed - No data to display  EKG   Radiology DG Hand 2 View Right  Result Date: 04/16/2022 CLINICAL DATA:  Right ring finger pain EXAM: RIGHT HAND - 2 VIEW COMPARISON:  None Available. FINDINGS: No acute fracture or dislocation.The joint spaces are preserved.Alignment is unremarkable.No significant soft tissue abnormality or foreign body. IMPRESSION: No acute osseous abnormality. Electronically Signed   By: Merilyn Baba M.D.   On: 04/16/2022 17:33    Procedures Procedures (including critical care time)  Medications Ordered in UC Medications - No data to display  Initial Impression / Assessment and Plan / UC Course  I have reviewed the triage vital  signs and the nursing notes.  Pertinent labs & imaging results that were available during my care of the patient were reviewed by me and considered in my medical decision making (see chart for details).  Right hand pain, xrays negative for fracture.  Supportive care discussed.  Advised follow up with ortho if no improvement.   Right lower eyelid stye.  Advised warm compress.   Elevated HTN today, pt asymptomatic.  On recheck BP 135/85.  She reports she has taken her BP medications today.  She has an appointment with PCP next week.  Discussed the importance of keeping this appointment.  ED precautions given.  Final Clinical Impressions(s) / UC Diagnoses   Final diagnoses:  Right hand pain  Hordeolum externum of right lower eyelid  Primary hypertension     Discharge Instructions      Apply ice to the right hand as needed.  Can take Ibuprofen or Tylenol as needed for pain. If no improvement can follow up with orthopedics.  Recommend warm compress to right eye for stye.  It is important that you follow up with primary care for management of you blood pressure. If you develop chest pain, headaches, shortness of breath go to the Emergency Department for evaluation.      ED Prescriptions   None    PDMP not reviewed this encounter.   Ward, Lenise Arena, PA-C 04/16/22 1745

## 2022-04-16 NOTE — Discharge Instructions (Addendum)
Apply ice to the right hand as needed.  Can take Ibuprofen or Tylenol as needed for pain. If no improvement can follow up with orthopedics.  Recommend warm compress to right eye for stye.  It is important that you follow up with primary care for management of you blood pressure. If you develop chest pain, headaches, shortness of breath go to the Emergency Department for evaluation.

## 2022-04-16 NOTE — ED Triage Notes (Signed)
Pt reports right hand pain after bending last two finger back while cleaning a dog cage 1 month ago. States she noticed swelling.   Pt also reports a "bump" on right lower eyelid. States she noticed it today when she woke up

## 2022-04-19 DIAGNOSIS — M62830 Muscle spasm of back: Secondary | ICD-10-CM | POA: Diagnosis not present

## 2022-04-19 DIAGNOSIS — M545 Low back pain, unspecified: Secondary | ICD-10-CM | POA: Diagnosis not present

## 2022-04-19 DIAGNOSIS — G629 Polyneuropathy, unspecified: Secondary | ICD-10-CM | POA: Diagnosis not present

## 2022-04-19 DIAGNOSIS — R7309 Other abnormal glucose: Secondary | ICD-10-CM | POA: Diagnosis not present

## 2022-04-19 DIAGNOSIS — G8929 Other chronic pain: Secondary | ICD-10-CM | POA: Diagnosis not present

## 2022-04-19 DIAGNOSIS — Z79899 Other long term (current) drug therapy: Secondary | ICD-10-CM | POA: Diagnosis not present

## 2022-04-19 DIAGNOSIS — M329 Systemic lupus erythematosus, unspecified: Secondary | ICD-10-CM | POA: Diagnosis not present

## 2022-04-19 DIAGNOSIS — E559 Vitamin D deficiency, unspecified: Secondary | ICD-10-CM | POA: Diagnosis not present

## 2022-04-19 DIAGNOSIS — Z6841 Body Mass Index (BMI) 40.0 and over, adult: Secondary | ICD-10-CM | POA: Diagnosis not present

## 2022-04-23 ENCOUNTER — Other Ambulatory Visit: Payer: Self-pay | Admitting: Internal Medicine

## 2022-04-23 DIAGNOSIS — J4520 Mild intermittent asthma, uncomplicated: Secondary | ICD-10-CM | POA: Diagnosis not present

## 2022-04-23 DIAGNOSIS — I10 Essential (primary) hypertension: Secondary | ICD-10-CM | POA: Diagnosis not present

## 2022-04-23 DIAGNOSIS — Z79899 Other long term (current) drug therapy: Secondary | ICD-10-CM | POA: Diagnosis not present

## 2022-04-23 DIAGNOSIS — M79641 Pain in right hand: Secondary | ICD-10-CM | POA: Diagnosis not present

## 2022-04-23 DIAGNOSIS — R7303 Prediabetes: Secondary | ICD-10-CM | POA: Diagnosis not present

## 2022-04-23 DIAGNOSIS — N760 Acute vaginitis: Secondary | ICD-10-CM | POA: Diagnosis not present

## 2022-04-23 DIAGNOSIS — M722 Plantar fascial fibromatosis: Secondary | ICD-10-CM | POA: Diagnosis not present

## 2022-04-24 LAB — C. TRACHOMATIS/N. GONORRHOEAE RNA
C. trachomatis RNA, TMA: NOT DETECTED
N. gonorrhoeae RNA, TMA: NOT DETECTED

## 2022-09-28 ENCOUNTER — Ambulatory Visit: Payer: Medicare Other | Admitting: Obstetrics and Gynecology

## 2022-10-12 ENCOUNTER — Other Ambulatory Visit: Payer: Self-pay | Admitting: Physician Assistant

## 2022-11-09 ENCOUNTER — Other Ambulatory Visit: Payer: Self-pay | Admitting: Physician Assistant

## 2022-11-26 ENCOUNTER — Ambulatory Visit (INDEPENDENT_AMBULATORY_CARE_PROVIDER_SITE_OTHER): Payer: 59 | Admitting: Obstetrics and Gynecology

## 2022-11-26 ENCOUNTER — Encounter: Payer: Self-pay | Admitting: Obstetrics and Gynecology

## 2022-11-26 ENCOUNTER — Other Ambulatory Visit (HOSPITAL_COMMUNITY)
Admission: RE | Admit: 2022-11-26 | Discharge: 2022-11-26 | Disposition: A | Discharge status: Home / Self Care | Payer: 59 | Source: Ambulatory Visit | Attending: Obstetrics and Gynecology | Admitting: Obstetrics and Gynecology

## 2022-11-26 VITALS — BP 154/96 | HR 74 | Ht 59.0 in | Wt 283.3 lb

## 2022-11-26 DIAGNOSIS — Z01419 Encounter for gynecological examination (general) (routine) without abnormal findings: Secondary | ICD-10-CM | POA: Diagnosis not present

## 2022-11-26 DIAGNOSIS — Z1231 Encounter for screening mammogram for malignant neoplasm of breast: Secondary | ICD-10-CM

## 2022-11-26 DIAGNOSIS — N898 Other specified noninflammatory disorders of vagina: Secondary | ICD-10-CM

## 2022-11-26 NOTE — Progress Notes (Signed)
   ANNUAL EXAM Patient name: Debra Cooper MRN [id number]  Date of birth: May 03, 1976 Chief Complaint:   No chief complaint on file.  History of Present Illness:   Debra Cooper is a 47 y.o. G1P1001 with No LMP recorded. Patient has had a hysterectomy. being seen today for a routine annual exam.  Current complaints:   IBS & recurrent yeast infections. Gets irritation, itching, and odor. Reviewed her chart - no testing for yeast infections have been positive in our system. Reports that her PCP gave her medication for infection and that it was helping.   Last pap 2022 normal per pt. Possible hx abnml pap but she is unsure when/why. Hysterectomy was for benign indication (AUB). Can likely stop doing paps.  Last mammogram: 2022. Results were: normal per pt report.  Last colonoscopy: 2019. Results were: abnormal polyps, was told she is next due 2027     11/26/2022    2:04 PM  Depression screen PHQ 2/9  Decreased Interest 0  Down, Depressed, Hopeless 0  PHQ - 2 Score 0  Altered sleeping 0  Tired, decreased energy 1  Change in appetite 1  Feeling bad or failure about yourself  0  Trouble concentrating 0  Moving slowly or fidgety/restless 0  Suicidal thoughts 0  PHQ-9 Score 2      11/26/2022    2:05 PM  GAD 7 : Generalized Anxiety Score  Nervous, Anxious, on Edge 0  Control/stop worrying 0  Worry too much - different things 0  Trouble relaxing 0  Restless 0  Easily annoyed or irritable 1  Afraid - awful might happen 0  Total GAD 7 Score 1   Review of Systems:   Pertinent items are noted in HPI Denies any headaches, blurred vision, fatigue, shortness of breath, chest pain, abdominal pain, bowel movements, urination, or intercourse unless otherwise stated above. Pertinent History Reviewed:  Reviewed past medical,surgical, social and family history.  Reviewed problem list, medications and allergies. Physical Assessment:   Vitals:   11/26/22 1355 11/26/22 1358  BP: (!) 177/109 (!)  154/96  Pulse: 72 74  Weight: 283 lb 4.8 oz (128.5 kg)   Height: 4' 11"$  (1.499 m)   Body mass index is 57.22 kg/m.        Physical Examination:   General appearance - well appearing, and in no distress  Mental status - alert, oriented to person, place, and time  Chest - respiratory effort normal  Heart - normal peripheral perfusion  Breasts - breasts appear normal, no suspicious masses, no skin or nipple changes or axillary nodes  Abdomen - soft, nontender, nondistended, no masses or organomegaly  Pelvic - pt declined pelvic exam today  Chaperone present for exam  No results found for this or any previous visit (from the past 24 hour(s)).  Assessment & Plan:  1) Well-Woman Exam Mammogram: schedule screening mammo as soon as possible Colonoscopy: will sign release of information for [facility] records. Pap: will sign release of information to confirm pap hx. If no prior abnormals, can discontinue pap:  2) Vaginal discharge Declines pelvic exam today Self swab collected  Labs/procedures today: Orders Placed This Encounter  Procedures   MM Digital Screening    Meds: No orders of the defined types were placed in this encounter.  Follow-up: Return in about 1 year (around 11/27/2023) for annual exam.  Inez Catalina, MD 11/26/2022 2:40 PM

## 2022-11-26 NOTE — Progress Notes (Signed)
New GYN presents for annual exam. Last PAP in 2022 was normal per pt. Hysterectomy 2012. Pt states she gets yeast infections often and Is currently having vaginal itching/ irritation. Last mammogram in 2022 and was normal per pt. Denies any abnormal breast changes. No other concerns at this time.

## 2022-11-28 ENCOUNTER — Telehealth: Payer: Self-pay

## 2022-11-28 LAB — CERVICOVAGINAL ANCILLARY ONLY
Bacterial Vaginitis (gardnerella): NEGATIVE
Candida Glabrata: NEGATIVE
Candida Vaginitis: NEGATIVE
Comment: NEGATIVE
Comment: NEGATIVE
Comment: NEGATIVE

## 2022-11-28 NOTE — Telephone Encounter (Signed)
Attempted to contact about results, no answer, vm is full.

## 2022-12-10 ENCOUNTER — Ambulatory Visit (HOSPITAL_BASED_OUTPATIENT_CLINIC_OR_DEPARTMENT_OTHER): Admission: RE | Admit: 2022-12-10 | Payer: 59 | Source: Ambulatory Visit | Admitting: Radiology

## 2023-01-18 ENCOUNTER — Ambulatory Visit (HOSPITAL_BASED_OUTPATIENT_CLINIC_OR_DEPARTMENT_OTHER): Admission: RE | Admit: 2023-01-18 | Payer: 59 | Source: Ambulatory Visit | Admitting: Radiology

## 2023-01-25 ENCOUNTER — Ambulatory Visit (HOSPITAL_BASED_OUTPATIENT_CLINIC_OR_DEPARTMENT_OTHER)
Admission: RE | Admit: 2023-01-25 | Discharge: 2023-01-25 | Disposition: A | Discharge status: Home / Self Care | Payer: 59 | Source: Ambulatory Visit | Attending: Obstetrics and Gynecology | Admitting: Obstetrics and Gynecology

## 2023-01-25 DIAGNOSIS — Z1231 Encounter for screening mammogram for malignant neoplasm of breast: Secondary | ICD-10-CM | POA: Diagnosis present

## 2023-05-15 ENCOUNTER — Other Ambulatory Visit: Payer: Self-pay | Admitting: Internal Medicine

## 2023-05-16 LAB — COMPLETE METABOLIC PANEL WITH GFR
AG Ratio: 1.4 (calc) (ref 1.0–2.5)
ALT: 20 U/L (ref 6–29)
AST: 23 U/L (ref 10–35)
Albumin: 4.4 g/dL (ref 3.6–5.1)
Alkaline phosphatase (APISO): 48 U/L (ref 31–125)
BUN: 11 mg/dL (ref 7–25)
CO2: 26 mmol/L (ref 20–32)
Calcium: 9.5 mg/dL (ref 8.6–10.2)
Chloride: 101 mmol/L (ref 98–110)
Creat: 0.86 mg/dL (ref 0.50–0.99)
Globulin: 3.2 g/dL (ref 1.9–3.7)
Glucose, Bld: 104 mg/dL — ABNORMAL HIGH (ref 65–99)
Potassium: 4.5 mmol/L (ref 3.5–5.3)
Sodium: 137 mmol/L (ref 135–146)
Total Bilirubin: 0.5 mg/dL (ref 0.2–1.2)
Total Protein: 7.6 g/dL (ref 6.1–8.1)
eGFR: 83 mL/min/{1.73_m2} (ref >=60)

## 2023-05-16 LAB — CBC
HCT: 37.3 % (ref 35.0–45.0)
Hemoglobin: 12.6 g/dL (ref 11.7–15.5)
MCH: 31.1 pg (ref 27.0–33.0)
MCHC: 33.8 g/dL (ref 32.0–36.0)
MCV: 92.1 fL (ref 80.0–100.0)
MPV: 9.9 fL (ref 7.5–12.5)
Platelets: 257 10*3/uL (ref 140–400)
RBC: 4.05 10*6/uL (ref 3.80–5.10)
RDW: 11.8 % (ref 11.0–15.0)
WBC: 5.6 10*3/uL (ref 3.8–10.8)

## 2023-05-16 LAB — LIPID PANEL
Cholesterol: 134 mg/dL (ref <200)
HDL: 50 mg/dL (ref >=50)
LDL Cholesterol (Calc): 65 mg/dL
Non-HDL Cholesterol (Calc): 84 mg/dL (ref <130)
Total CHOL/HDL Ratio: 2.7 (calc) (ref <5.0)
Triglycerides: 109 mg/dL (ref <150)

## 2023-05-16 LAB — VITAMIN D 25 HYDROXY (VIT D DEFICIENCY, FRACTURES): Vit D, 25-Hydroxy: 40 ng/mL (ref 30–100)

## 2023-05-16 LAB — FOLATE: Folate: 12.5 ng/mL

## 2023-05-16 LAB — VITAMIN B12: Vitamin B-12: 413 pg/mL (ref 200–1100)

## 2023-05-16 LAB — TSH: TSH: 2.83 m[IU]/L

## 2023-06-06 ENCOUNTER — Ambulatory Visit (INDEPENDENT_AMBULATORY_CARE_PROVIDER_SITE_OTHER): Payer: 59

## 2023-06-06 ENCOUNTER — Ambulatory Visit: Payer: 59

## 2023-06-06 ENCOUNTER — Ambulatory Visit (INDEPENDENT_AMBULATORY_CARE_PROVIDER_SITE_OTHER): Payer: 59 | Admitting: Podiatry

## 2023-06-06 DIAGNOSIS — M722 Plantar fascial fibromatosis: Secondary | ICD-10-CM

## 2023-06-06 MED ORDER — METHYLPREDNISOLONE 4 MG PO TBPK: ORAL_TABLET | ORAL | 0 refills | Status: DC

## 2023-06-06 MED ORDER — MELOXICAM 15 MG PO TABS: 15.0000 mg | ORAL_TABLET | Freq: Every day | ORAL | 0 refills | Status: DC

## 2023-06-06 NOTE — Progress Notes (Signed)
  Subjective:  Patient ID: Debra Cooper, female    DOB: 20-Sep-1976,  MRN: [id number]  Chief Complaint  Patient presents with   Plantar Fasciitis    np - bil plantar fasciitis - left foot is worse    47 y.o. female presents with the above complaint.  Patient presents with pain in the bilateral heel.  She has previously had injections for plantar fasciitis with some improvement.  Review of Systems: Negative except as noted in the HPI. Denies N/V/F/Ch.   Objective:  There were no vitals filed for this visit. There is no height or weight on file to calculate BMI. Constitutional Well developed. Well nourished.  Vascular Dorsalis pedis pulses palpable bilaterally. Posterior tibial pulses palpable bilaterally. Capillary refill normal to all digits.  No cyanosis or clubbing noted. Pedal hair growth normal.  Neurologic Normal speech. Oriented to person, place, and time. Epicritic sensation to light touch grossly present bilaterally.  Dermatologic Nails well groomed and normal in appearance. No open wounds. No skin lesions.  Orthopedic: Normal joint ROM without pain or crepitus bilaterally. No visible deformities. Tender to palpation at the calcaneal tuber bilaterally. No pain with calcaneal squeeze bilaterally. Ankle ROM diminished range of motion bilaterally.    Radiographs: Taken and reviewed. No acute fractures or dislocations. No evidence of stress fracture.  Plantar heel spur absent. Posterior heel spur absent.   Assessment:   1. Plantar fasciitis, bilateral    Plan:  Patient was evaluated and treated and all questions answered.  Plantar Fasciitis, bilaterally - XR reviewed as above.  - Educated on icing and stretching. Instructions given.  - Injection deferred at this time - DME: DME deferred - Pharmacologic management: Meloxicam 15 mg once daily for next 30 days and methylprednisolone 4 mg steroid taper pack take as directed for 6 days. Educated on risks/benefits and  proper taking of medication.    No follow-ups on file.

## 2023-06-06 NOTE — Patient Instructions (Signed)

## 2023-06-24 ENCOUNTER — Telehealth: Payer: Self-pay

## 2023-06-24 NOTE — Telephone Encounter (Signed)
error 

## 2023-07-04 ENCOUNTER — Ambulatory Visit: Payer: 59 | Admitting: Podiatry

## 2023-07-12 ENCOUNTER — Ambulatory Visit: Payer: 59 | Admitting: Podiatry

## 2023-07-12 DIAGNOSIS — Z91199 Patient's noncompliance with other medical treatment and regimen due to unspecified reason: Secondary | ICD-10-CM

## 2023-07-12 NOTE — Progress Notes (Signed)
No show for apt.

## 2023-09-03 ENCOUNTER — Other Ambulatory Visit: Payer: Self-pay | Admitting: Podiatry

## 2023-09-03 ENCOUNTER — Other Ambulatory Visit: Payer: Self-pay | Admitting: Physician Assistant

## 2023-12-02 ENCOUNTER — Other Ambulatory Visit: Payer: Self-pay | Admitting: Physician Assistant

## 2023-12-09 ENCOUNTER — Other Ambulatory Visit: Payer: Self-pay | Admitting: Podiatry

## 2024-02-17 NOTE — Progress Notes (Signed)
 Assessment 1. Other systemic lupus erythematosus with other organ involvement    (CMD)  CBC with Differential   Complement C4 Level   Complement C3 Level   Urinalysis with Reflex to Microscopic   dsDNA Antibody, IFA with Reflex to Titer   Comprehensive Metabolic Panel   Ambulatory referral to Ophthalmology    2. Fibromyalgia      3. Drug therapy  CBC with Differential   Complement C4 Level   Complement C3 Level   Urinalysis with Reflex to Microscopic   dsDNA Antibody, IFA with Reflex to Titer   Comprehensive Metabolic Panel   Ambulatory referral to Ophthalmology      Plan/Orders To measure lupus activity, patient will have above tests. Continue plaquenil, benefits, risks, potential eye toxicity. Avoid the sun. Keep mouth and eyes moist.  Advised her to see dentist.  See PCP about HTN.  Return in about 6 months (around 08/19/2024).  Chief Complaint Debra Cooper is here for follow up lupus.   HPI Follow up lupus, therapy monitoring.  On HCQ 400 mg daily. Eye exam 12/2022, needs a new referral to new ophthalmologist.  Patient reports she was diagnosed with lupus in [place] in 2011-2012. No records are available.  Her presenting symptoms were arthralgias, myalgias, malar rash, and positive ANA. She has been treated with plaquenil. No rashes now. No Raynaud's symptoms  + mucosal ulcers + mouth or eye dryness No history of pleurisy or pericarditis  No history of DVT, MI, or strokes.  No history of hematuria or nephritis. No seizures. No miscarriages. Patient with chronic pain, sees pain management at [facility].  Medical history of HTN, DM, obesity, GERD, fibromyalgia.  Vital Signs BP (!) 142/96 (BP Location: Left arm, Patient Position: Sitting)   Pulse 87   Ht 1.499 m (4' 11)   Wt (!) 137 kg (302 lb 6.4 oz)   SpO2 96%   BMI 61.08 kg/m  Physical Exam Pt is alert and oriented x 3. Answers questions appropriately.  Heart: normal S1, S2. No S3, no murmur, no thrills. No  JVD. Lungs: no dyspnea observed, normal breath sounds on auscultation.  Extremities: no cyanosis, no edema, no calves tenderness. Skin: warm, dry. Peridental disease. Morbidly obese. No hot swollen joints.   PMH Past Medical History:  Diagnosis Date  . Arthritis   . Diabetes mellitus    (CMD)    pre diabetic   . Fibromyalgia   . Hypertension   . Obesity     PSH Past Surgical History:  Procedure Laterality Date  . CHOLECYSTECTOMY     Procedure: CHOLECYSTECTOMY  . HYSTERECTOMY      Procedure: HYSTERECTOMY    Allergies Patient has no known allergies.  Medications Current Outpatient Medications on File Prior to Visit  Medication Sig Dispense Refill  . budesonide-formoteroL (SYMBICORT) 160-4.5 mcg/actuation inhaler Inhale 2 puffs 2 (two) times a day.    . cetirizine  (ZyrTEC ) 10 mg tablet Take 10 mg by mouth Once Daily.    . cholestyramine  4 gram powd 1 packet mixed with water or non-carbonated drink Orally twice a day    . cyclobenzaprine (FLEXERIL) 10 mg tablet 1 Tablet Tablet three times daily, as needed for muscle spasm    . diclofenac (VOLTAREN) 0.1 % drop 1 drop 4 (four) times a day.    . dicyclomine  (BENTYL ) 10 mg capsule Take 10 mg by mouth.    . dilTIAZem (CARDIZEM CD) 240 mg 24 hr capsule     . DULoxetine (CYMBALTA) 30 mg capsule  Take 30 mg by mouth.    . gabapentin (NEURONTIN) 100 mg capsule Take 100 mg by mouth.    . hydroxychloroquine (PLAQUENIL) 200 mg tablet Take 2 tablets (400 mg total) by mouth daily. 180 tablet 0  . labetaloL (NORMODYNE) 100 mg tablet Take 100 mg by mouth.    . losartan (COZAAR) 100 mg tablet     . multivitamin (THERAGRAN) tab tablet Take 1 tablet by mouth Once Daily.    SABRA omeprazole (PriLOSEC) 40 mg DR capsule     . oxyCODONE-acetaminophen (PERCOCET) 10-325 mg per tablet 1 Tablet by mouth every six hours, as needed    . Ozempic 0.25 mg or 0.5 mg (2 mg/3 mL) pen injector     . potassium chloride (KLOR-CON) 10 mEq ER tablet Take 10 mEq by  mouth Once Daily.    . potassium Cl/lido/0.9 % NaCl (potassium Cl-lido-0.9 % sodchl) 10 mEq-10 mg /100 mL pgbk Infuse  into a venous catheter.    . triamcinolone  (KENALOG ) 0.5 % cream Apply  topically 2 (two) times a day.    . triamterene-hydroCHLOROthiazide (MAXZIDE-25) 37.5-25 mg per tablet Take 1 tablet by mouth Once Daily.    . valACYclovir (VALTREX) 500 mg tablet      No current facility-administered medications on file prior to visit.     Family Hx Family History  Problem Relation Name Age of Onset  . Glaucoma Father    . Rheum arthritis Neg Hx    . Lupus Neg Hx    . Ankylosing spondylitis Neg Hx    . Psoriasis Neg Hx    . Gout Neg Hx    . Macular degeneration Neg Hx    . Retinal detachment Neg Hx    . Cataracts Neg Hx      Social Hx  Social History   Tobacco Use  . Smoking status: Never  . Smokeless tobacco: Never  Substance Use Topics  . Alcohol use: Not Currently  . Drug use: Never

## 2024-06-23 ENCOUNTER — Other Ambulatory Visit: Payer: Self-pay

## 2024-06-23 ENCOUNTER — Ambulatory Visit (HOSPITAL_COMMUNITY)
Admission: EM | Admit: 2024-06-23 | Discharge: 2024-06-23 | Disposition: A | Discharge status: Home / Self Care | Attending: Student | Admitting: Student

## 2024-06-23 ENCOUNTER — Encounter (HOSPITAL_COMMUNITY): Payer: Self-pay | Admitting: *Deleted

## 2024-06-23 DIAGNOSIS — H8111 Benign paroxysmal vertigo, right ear: Secondary | ICD-10-CM

## 2024-06-23 DIAGNOSIS — H60391 Other infective otitis externa, right ear: Secondary | ICD-10-CM

## 2024-06-23 MED ORDER — CIPRO HC 0.2-1 % OT SUSP: 3.0000 [drp] | Freq: Two times a day (BID) | OTIC | 0 refills | Status: AC

## 2024-06-23 NOTE — ED Provider Notes (Signed)
 [facility]    CSN: [id number] Arrival date & time: 06/23/24  1623      History   Chief Complaint Chief Complaint  Patient presents with   Otalgia    HPI Debra Cooper is a 48 y.o. female presenting with bilateral ear pain for about 1 month. She saw her pain doctor on 06/22/24, and he gave her a shot of something, but she is unsure if this is an antibiotic. She had an ear lavage about 1 month ago due to cerumen. Describes sensation of room spinning, which is triggered by moving, particularly standing; and terminates on its own within a few seconds/minutes. Endorses decreased hearing R ear, accompanied by tinnitus. Denies recent URI.  Denies chest pain, shortness of breath.  HPI  Past Medical History:  Diagnosis Date   Arthritis    Binge eating disorder    Diabetes mellitus, type II (HCC)    Fibromyalgia    Hypertension    Lupus    Osteoarthritis     There are no active problems to display for this patient.   Past Surgical History:  Procedure Laterality Date   CHOLECYSTECTOMY     VAGINAL HYSTERECTOMY      OB History     Gravida  1   Para  1   Term  1   Preterm      AB      Living  1      SAB      IAB      Ectopic      Multiple      Live Births  1            Home Medications    Prior to Admission medications   Medication Sig Start Date End Date Taking? Authorizing Provider  cholestyramine  (QUESTRAN ) 4 g packet take ONE PACKET by MOUTH FOUR TIMES DAILY 11/09/22  Yes Beather Delon Gibson, PA  ciprofloxacin -hydrocortisone (CIPRO  HC) OTIC suspension Place 3 drops into the right ear 2 (two) times daily for 7 days. 06/23/24 06/30/24 Yes Willette Mudry E, PA-C  dicyclomine  (BENTYL ) 10 MG capsule Take 1 every 4-6 hours as needed 10/11/21  Yes Lemmon, Delon Gibson, PA  DULoxetine (CYMBALTA) 30 MG capsule Take 30 mg by mouth daily.   Yes [provider]  gabapentin (NEURONTIN) 100 MG capsule Take 100 mg by mouth 3 (three) times  daily.   Yes [provider]  HYDROcodone-acetaminophen (NORCO/VICODIN) 5-325 MG tablet Take 1 tablet by mouth every 6 (six) hours as needed for moderate pain.   Yes [provider]  hydroxychloroquine (PLAQUENIL) 200 MG tablet Take daily by mouth.   Yes [provider]  labetalol (NORMODYNE) 100 MG tablet Take 100 mg 2 (two) times daily by mouth.   Yes [provider]  losartan (COZAAR) 100 MG tablet Take 100 mg by mouth daily.   Yes [provider]  meloxicam  (MOBIC ) 15 MG tablet Take 1 tablet (15 mg total) by mouth daily. 12/10/23  Yes Standiford, Marsa FALCON, DPM  Multiple Vitamin (MULTIVITAMIN) tablet Take 1 tablet by mouth daily.   Yes [provider]  omeprazole (PRILOSEC) 40 MG capsule Take 40 mg daily by mouth.   Yes [provider]  potassium chloride (KLOR-CON) 10 MEQ tablet Take 10 mEq by mouth daily.   Yes [provider]  triamterene-hydrochlorothiazide (MAXZIDE-25) 37.5-25 MG tablet Take 1 tablet by mouth daily.   Yes [provider]    Family History Family History  Problem  Relation Age of Onset   Hypertension Mother    Hypertension Father    Heart failure Father    Cancer Father    Colon polyps Neg Hx    Colon cancer Neg Hx     Social History Social History   Tobacco Use   Smoking status: Never   Smokeless tobacco: Never  Vaping Use   Vaping status: Never Used  Substance Use Topics   Alcohol use: No   Drug use: No     Allergies   Patient has no known allergies.   Review of Systems Review of Systems  HENT:  Positive for ear pain.   Neurological:  Positive for dizziness.     Physical Exam Triage Vital Signs ED Triage Vitals  Encounter Vitals Group     BP 06/23/24 1703 (!) 156/108     Girls Systolic BP Percentile --      Girls Diastolic BP Percentile --      Boys Systolic BP Percentile --      Boys Diastolic BP Percentile --      Pulse Rate 06/23/24 1703 81     Resp  06/23/24 1703 20     Temp 06/23/24 1703 98.3 F (36.8 C)     Temp src --      SpO2 06/23/24 1703 93 %     Weight --      Height --      Head Circumference --      Peak Flow --      Pain Score 06/23/24 1700 0     Pain Loc --      Pain Education --      Exclude from Growth Chart --    No data found.  Updated Vital Signs BP (!) 156/108   Pulse 81   Temp 98.3 F (36.8 C)   Resp 20   SpO2 93%   Visual Acuity Right Eye Distance:   Left Eye Distance:   Bilateral Distance:    Right Eye Near:   Left Eye Near:    Bilateral Near:     Physical Exam Vitals reviewed.  Constitutional:      Appearance: Normal appearance. She is not ill-appearing.  HENT:     Head: Normocephalic and atraumatic.     Right Ear: Hearing, tympanic membrane and external ear normal. No swelling or tenderness. No middle ear effusion. There is no impacted cerumen. No mastoid tenderness. Tympanic membrane is not injected, scarred, perforated, erythematous, retracted or bulging.     Left Ear: Hearing, tympanic membrane, ear canal and external ear normal. No swelling or tenderness.  No middle ear effusion. There is no impacted cerumen. No mastoid tenderness. Tympanic membrane is not injected, scarred, perforated, erythematous, retracted or bulging.     Ears:     Comments: Right external auditory canal is erythematous.  Tragus is tender to palpation.  There is no exudate.  There is no cervical lymphadenopathy.  Bilateral tympanic membranes are pearly gray, intact, and not erythematous.    Mouth/Throat:     Pharynx: Oropharynx is clear. No oropharyngeal exudate or posterior oropharyngeal erythema.  Eyes:     Extraocular Movements: Extraocular movements intact.     Pupils: Pupils are equal, round, and reactive to light.  Cardiovascular:     Rate and Rhythm: Normal rate and regular rhythm.     Heart sounds: Normal heart sounds.  Pulmonary:     Effort: Pulmonary effort is normal.     Breath sounds: Normal breath  sounds.  Lymphadenopathy:     Cervical: No cervical adenopathy.  Neurological:     General: No focal deficit present.     Mental Status: She is alert and oriented to person, place, and time.  Psychiatric:        Mood and Affect: Mood normal.        Behavior: Behavior normal.        Thought Content: Thought content normal.        Judgment: Judgment normal.      UC Treatments / Results  Labs (all labs ordered are listed, but only abnormal results are displayed) Labs Reviewed - No data to display  EKG   Radiology No results found.  Procedures Procedures (including critical care time)  Medications Ordered in UC Medications - No data to display  Initial Impression / Assessment and Plan / UC Course  I have reviewed the triage vital signs and the nursing notes.  Pertinent labs & imaging results that were available during my care of the patient were reviewed by me and considered in my medical decision making (see chart for details).  Clinical Course as of 06/23/24 1749  Tue Jun 23, 2024  1734 BP(!): 156/108 [LG]    Clinical Course User Index [LG] Arlyss Leita BRAVO, PA-C    Acute otitis externa  Exam consistent with right otitis externa.  Will manage with ciprofloxacin  drops and clean dry ear precautions. BPPV Positive Dix-Hallpike.  Her neurological exam is wnl, with no chest pain, shortness of breath, weakness; I do not have concern for neurological or intracranial pathology. ENT referral sent  Final Clinical Impressions(s) / UC Diagnoses   Final diagnoses:  Other infective acute otitis externa of right ear  Benign paroxysmal positional vertigo of right ear     Discharge Instructions      - You have an external ear infection of your right ear.  This means the ear canal is infected.  Use the ciprofloxacin  drops twice daily for 7 days.  Make sure to keep the ear dry for the next 7 days.  Use earplugs or cover the ear while you are showering. -Your vertigo is  probably caused by an imbalance in the inner ear.  This can be managed by an ear nose and throat doctor.  I have sent a referral.  If your symptoms change, including dizziness without movement, lightheadedness with standing, headaches, chest pain, seek additional medical attention.     ED Prescriptions     Medication Sig Dispense Auth. Provider   ciprofloxacin -hydrocortisone (CIPRO  HC) OTIC suspension Place 3 drops into the right ear 2 (two) times daily for 7 days. 10 mL Denis Carreon E, PA-C      PDMP not reviewed this encounter.   Arlyss Leita BRAVO, PA-C 06/23/24 1752

## 2024-06-23 NOTE — ED Triage Notes (Signed)
 PT reports pain in both ears since August . Pt went to pain Dr.on Monday who looked in her ears and told her she had a ear infection.  Pt had a ear wash at PCP in August . Pt had an appt on Monday but canceled  the appt. Pt is going this Friday instead. Pt has been dizzy for one week

## 2024-06-23 NOTE — Discharge Instructions (Addendum)
-   You have an external ear infection of your right ear.  This means the ear canal is infected.  Use the ciprofloxacin  drops twice daily for 7 days.  Make sure to keep the ear dry for the next 7 days.  Use earplugs or cover the ear while you are showering. -Your vertigo is probably caused by an imbalance in the inner ear.  This can be managed by an ear nose and throat doctor.  I have sent a referral.  If your symptoms change, including dizziness without movement, lightheadedness with standing, headaches, chest pain, seek additional medical attention.

## 2024-07-10 ENCOUNTER — Ambulatory Visit (INDEPENDENT_AMBULATORY_CARE_PROVIDER_SITE_OTHER)

## 2024-07-10 ENCOUNTER — Encounter (INDEPENDENT_AMBULATORY_CARE_PROVIDER_SITE_OTHER): Payer: Self-pay

## 2024-07-10 VITALS — BP 148/96 | HR 86 | Ht 59.0 in | Wt 303.0 lb

## 2024-07-10 DIAGNOSIS — H9311 Tinnitus, right ear: Secondary | ICD-10-CM

## 2024-07-10 DIAGNOSIS — H9191 Unspecified hearing loss, right ear: Secondary | ICD-10-CM | POA: Diagnosis not present

## 2024-07-10 DIAGNOSIS — H60311 Diffuse otitis externa, right ear: Secondary | ICD-10-CM

## 2024-07-10 NOTE — Progress Notes (Signed)
 Dear Dr. Arlyss, Here is my assessment for our mutual patient, Debra Cooper. Thank you for allowing me the opportunity to care for your patient. Please do not hesitate to contact me should you have any other questions. Sincerely, Dr. Hadassah Parody  Otolaryngology Clinic Note Referring provider: Dr. Arlyss HPI:   Initial HPI (07/10/2024) Discussed the use of AI scribe software for clinical note transcription with the patient, who gave verbal consent to proceed.  History of Present Illness Debra Cooper is a 48 year old female who presents with recurrent ear infections and ear wax buildup.  Right ear infection and ear fullness Right hearing loss  - Intermittent right ear clogging since June, initially relieved by ear flushing and cleaning in June and August - Persistent intermittent ear clogging despite prior treatments. Tinnitus on the right.  - No current otorrhea or pain  - was diagnosed with right otitis externa - Was given ear drops at urgent care and these have been helpful   Vestibular symptoms - was having dizzy spells associated but has since resolved    She is no longer having any symptoms on the right except for hearing loss and aural fullness    Independent Review of Additional Tests or Records:  Note 06/23/24 from Leita Arlyss reviewed: right OE treated with ciprodex   PMH/Meds/All/SocHx/FamHx/ROS:   Past Medical History:  Diagnosis Date   Arthritis    Binge eating disorder    Diabetes mellitus, type II (HCC)    Fibromyalgia    Hypertension    Lupus    Osteoarthritis      Past Surgical History:  Procedure Laterality Date   CHOLECYSTECTOMY     VAGINAL HYSTERECTOMY      Family History  Problem Relation Age of Onset   Hypertension Mother    Hypertension Father    Heart failure Father    Cancer Father    Colon polyps Neg Hx    Colon cancer Neg Hx      Social Connections: Not on file     Current Outpatient Medications  Medication Instructions    cholestyramine  (QUESTRAN ) 4 g packet take ONE PACKET by MOUTH FOUR TIMES DAILY   dicyclomine  (BENTYL ) 10 MG capsule Take 1 every 4-6 hours as needed   DULoxetine (CYMBALTA) 30 mg, Daily   gabapentin (NEURONTIN) 100 mg, 3 times daily   HYDROcodone-acetaminophen (NORCO/VICODIN) 5-325 MG tablet 1 tablet, Every 6 hours PRN   hydroxychloroquine (PLAQUENIL) 200 MG tablet Daily   labetalol (NORMODYNE) 100 mg, 2 times daily   losartan (COZAAR) 100 mg, Daily   meloxicam  (MOBIC ) 15 mg, Oral, Daily   Multiple Vitamin (MULTIVITAMIN) tablet 1 tablet, Daily   omeprazole (PRILOSEC) 40 mg, Daily   potassium chloride (KLOR-CON) 10 MEQ tablet 10 mEq, Daily   triamterene-hydrochlorothiazide (MAXZIDE-25) 37.5-25 MG tablet 1 tablet, Daily     Physical Exam:   BP (!) 148/96 (BP Location: Right Arm, Patient Position: Sitting) Comment: cab dropped her off at wrong spot she had to walk here  Pulse 86   Ht 4' 11 (1.499 m)   Wt (!) 303 lb (137.4 kg)   SpO2 91%   BMI 61.20 kg/m   Salient findings:  CN II-XII intact Bilateral EAC clear and TM intact with well pneumatized middle ear spaces. No evidence of residual infection  No lesions of oral cavity/oropharynx; dentition poor with multiple missing / broken teeth  No respiratory distress or stridor  Seprately Identifiable Procedures:  Prior to initiating any procedures, risks/benefits/alternatives were explained  to the patient and verbal consent obtained.  Procedure (07/10/2024): Bilateral ear microscopy using microscope (CPT [id number]) Pre-procedure diagnosis: otitis externa (right), right hearing loss  Post-procedure diagnosis: same Indication: see above; given patient's otologic complaints and history, for improved and comprehensive examination of external ear and tympanic membrane, bilateral otologic examination using microscope was performed. Prior to proceeding, verbal consent was obtained after discussion of R/B/A  Procedure: Patient was placed  semi-recumbent. Both ear canals were examined using the microscope with findings above. Patient tolerated the procedure well.   Impression & Plans:  Debra Cooper is a 48 y.o. female with   1. Acute diffuse otitis externa of right ear   2. Hearing loss of right ear, unspecified hearing loss type   3. Right-sided tinnitus     Assessment and Plan Assessment & Plan Right otitis externa Right subjective hearing loss Right aural fullness - Otitis externa resolved with ciprodex drops  - Physical exam reassuring  - Discussed ordering audiogram given persistent right sided hearing loss and tinnitus  - Schedule follow-up with audiogram    See below regarding exact medications prescribed this encounter including dosages and route: No orders of the defined types were placed in this encounter.    Thank you for allowing me the opportunity to care for your patient. Please do not hesitate to contact me should you have any other questions.  Sincerely, Hadassah Parody, MD Otolaryngologist (ENT), [facility] Phone: [phone] Fax: [phone]

## 2024-09-11 ENCOUNTER — Ambulatory Visit (INDEPENDENT_AMBULATORY_CARE_PROVIDER_SITE_OTHER)

## 2024-09-11 ENCOUNTER — Ambulatory Visit (INDEPENDENT_AMBULATORY_CARE_PROVIDER_SITE_OTHER): Admitting: Audiology

## 2024-10-28 ENCOUNTER — Ambulatory Visit (INDEPENDENT_AMBULATORY_CARE_PROVIDER_SITE_OTHER)

## 2024-10-28 ENCOUNTER — Ambulatory Visit (INDEPENDENT_AMBULATORY_CARE_PROVIDER_SITE_OTHER): Admitting: Audiology

## 2024-10-28 ENCOUNTER — Telehealth (INDEPENDENT_AMBULATORY_CARE_PROVIDER_SITE_OTHER): Payer: Self-pay

## 2024-10-28 NOTE — Telephone Encounter (Signed)
 Called patient to let them know that because they did not show up for their hearing evaluation today we would need to reschedule their appointment with Dr. Greggory next week 11/04/2024 until they can get their hearing evaluation done. Patient did not answer I left a voicemail and asked them to give us  a call back to get the appointments rescheduled.

## 2024-11-04 ENCOUNTER — Ambulatory Visit (INDEPENDENT_AMBULATORY_CARE_PROVIDER_SITE_OTHER)

## 2024-12-04 ENCOUNTER — Ambulatory Visit (INDEPENDENT_AMBULATORY_CARE_PROVIDER_SITE_OTHER): Admitting: Audiology

## 2024-12-04 ENCOUNTER — Ambulatory Visit (INDEPENDENT_AMBULATORY_CARE_PROVIDER_SITE_OTHER)
